# Patient Record
Sex: Male | Born: 1970 | Race: White | Hispanic: No | Marital: Married | Smoking: Current every day smoker
Health system: Southern US, Community
[De-identification: ages and names within clinical notes are randomized; demographics above are authoritative.]

## PROBLEM LIST (undated history)

## (undated) DIAGNOSIS — I10 Essential (primary) hypertension: Secondary | ICD-10-CM

## (undated) DIAGNOSIS — N200 Calculus of kidney: Secondary | ICD-10-CM

---

## 2007-10-25 ENCOUNTER — Emergency Department: Payer: Self-pay | Admitting: Unknown Physician Specialty

## 2008-03-17 ENCOUNTER — Emergency Department: Payer: Self-pay | Admitting: Emergency Medicine

## 2008-09-21 ENCOUNTER — Emergency Department: Payer: Self-pay | Admitting: Emergency Medicine

## 2009-04-03 ENCOUNTER — Inpatient Hospital Stay: Payer: Self-pay | Admitting: Vascular Surgery

## 2009-10-18 ENCOUNTER — Emergency Department: Payer: Self-pay | Admitting: Emergency Medicine

## 2010-01-17 ENCOUNTER — Emergency Department (HOSPITAL_COMMUNITY): Admission: EM | Admit: 2010-01-17 | Discharge: 2010-01-17 | Payer: Self-pay | Admitting: Emergency Medicine

## 2010-02-18 ENCOUNTER — Emergency Department (HOSPITAL_COMMUNITY): Admission: EM | Admit: 2010-02-18 | Discharge: 2010-02-18 | Payer: Self-pay | Admitting: Emergency Medicine

## 2010-03-07 ENCOUNTER — Emergency Department (HOSPITAL_COMMUNITY): Admission: EM | Admit: 2010-03-07 | Discharge: 2010-03-07 | Payer: Self-pay | Admitting: Emergency Medicine

## 2010-04-09 ENCOUNTER — Emergency Department: Payer: Self-pay | Admitting: Emergency Medicine

## 2010-05-21 ENCOUNTER — Ambulatory Visit: Payer: Self-pay | Admitting: Unknown Physician Specialty

## 2010-06-12 ENCOUNTER — Ambulatory Visit: Payer: Self-pay | Admitting: Unknown Physician Specialty

## 2010-06-25 ENCOUNTER — Ambulatory Visit: Payer: Self-pay | Admitting: Unknown Physician Specialty

## 2010-06-29 ENCOUNTER — Emergency Department: Payer: Self-pay | Admitting: Emergency Medicine

## 2010-07-18 ENCOUNTER — Emergency Department: Payer: Self-pay | Admitting: Emergency Medicine

## 2010-12-27 ENCOUNTER — Inpatient Hospital Stay: Payer: Self-pay | Admitting: Internal Medicine

## 2011-01-19 ENCOUNTER — Emergency Department: Payer: Self-pay | Admitting: Internal Medicine

## 2011-04-11 ENCOUNTER — Emergency Department: Payer: Self-pay | Admitting: Emergency Medicine

## 2011-12-15 ENCOUNTER — Emergency Department: Payer: Self-pay | Admitting: *Deleted

## 2011-12-16 ENCOUNTER — Other Ambulatory Visit: Payer: Self-pay | Admitting: Podiatry

## 2012-02-26 ENCOUNTER — Inpatient Hospital Stay: Payer: Self-pay | Admitting: Specialist

## 2012-02-26 LAB — DRUG SCREEN, URINE
Benzodiazepine, Ur Scrn: NEGATIVE (ref ?–200)
Cannabinoid 50 Ng, Ur ~~LOC~~: NEGATIVE (ref ?–50)
Cocaine Metabolite,Ur ~~LOC~~: POSITIVE (ref ?–300)
MDMA (Ecstasy)Ur Screen: NEGATIVE (ref ?–500)
Methadone, Ur Screen: POSITIVE (ref ?–300)
Phencyclidine (PCP) Ur S: NEGATIVE (ref ?–25)

## 2012-02-26 LAB — COMPREHENSIVE METABOLIC PANEL
Alkaline Phosphatase: 668 U/L — ABNORMAL HIGH (ref 50–136)
BUN: 10 mg/dL (ref 7–18)
Bilirubin,Total: 18.2 mg/dL — ABNORMAL HIGH (ref 0.2–1.0)
Calcium, Total: 9.7 mg/dL (ref 8.5–10.1)
Chloride: 104 mmol/L (ref 98–107)
Co2: 24 mmol/L (ref 21–32)
Creatinine: 0.77 mg/dL (ref 0.60–1.30)
EGFR (African American): >60
EGFR (Non-African Amer.): >60
SGOT(AST): 751 U/L — ABNORMAL HIGH (ref 15–37)
SGPT (ALT): 289 U/L — ABNORMAL HIGH (ref 12–78)

## 2012-02-26 LAB — CBC
HCT: 51.5 % (ref 40.0–52.0)
Platelet: 290 10*3/uL (ref 150–440)
RBC: 5.47 10*6/uL (ref 4.40–5.90)
RDW: 17.5 % — ABNORMAL HIGH (ref 11.5–14.5)
WBC: 7.2 10*3/uL (ref 3.8–10.6)

## 2012-02-26 LAB — URINALYSIS, COMPLETE
Bacteria: NONE SEEN
Glucose,UR: NEGATIVE mg/dL (ref 0–75)
Ketone: NEGATIVE
Leukocyte Esterase: NEGATIVE
Nitrite: NEGATIVE
Protein: NEGATIVE
RBC,UR: <1 /HPF (ref 0–5)
Specific Gravity: 1.018 (ref 1.003–1.030)
Squamous Epithelial: NONE SEEN
WBC UR: 2 /HPF (ref 0–5)

## 2012-02-26 LAB — MAGNESIUM: Magnesium: 2.7 mg/dL — ABNORMAL HIGH

## 2012-02-26 LAB — ETHANOL: Ethanol: <3 mg/dL

## 2012-02-26 LAB — BILIRUBIN, DIRECT: Bilirubin, Direct: 13.5 mg/dL — ABNORMAL HIGH (ref 0.00–0.20)

## 2012-02-26 LAB — APTT: Activated PTT: 34.9 secs (ref 23.6–35.9)

## 2012-02-26 LAB — LIPASE, BLOOD: Lipase: 290 U/L (ref 73–393)

## 2012-02-26 LAB — PROTIME-INR: Prothrombin Time: 12.6 secs (ref 11.5–14.7)

## 2012-02-27 LAB — LIPID PANEL
Cholesterol: 60 mg/dL (ref 0–200)
HDL Cholesterol: 5 mg/dL — ABNORMAL LOW (ref 40–60)
Ldl Cholesterol, Calc: 6 mg/dL (ref 0–100)
VLDL Cholesterol, Calc: 49 mg/dL — ABNORMAL HIGH (ref 5–40)

## 2012-02-27 LAB — COMPREHENSIVE METABOLIC PANEL
Albumin: 3.1 g/dL — ABNORMAL LOW (ref 3.4–5.0)
Alkaline Phosphatase: 503 U/L — ABNORMAL HIGH (ref 50–136)
Anion Gap: 7 (ref 7–16)
BUN: 10 mg/dL (ref 7–18)
Calcium, Total: 8.5 mg/dL (ref 8.5–10.1)
Co2: 24 mmol/L (ref 21–32)
Creatinine: 0.71 mg/dL (ref 0.60–1.30)
EGFR (Non-African Amer.): >60
Glucose: 82 mg/dL (ref 65–99)
Osmolality: 278 (ref 275–301)
SGOT(AST): 651 U/L — ABNORMAL HIGH (ref 15–37)
Sodium: 140 mmol/L (ref 136–145)

## 2012-02-27 LAB — RAPID HIV-1/2 QL/CONFIRM: HIV-1/2,Rapid Ql: NEGATIVE

## 2012-02-28 LAB — COMPREHENSIVE METABOLIC PANEL
Albumin: 3.1 g/dL — ABNORMAL LOW (ref 3.4–5.0)
Alkaline Phosphatase: 458 U/L — ABNORMAL HIGH (ref 50–136)
BUN: 9 mg/dL (ref 7–18)
Bilirubin,Total: 14.8 mg/dL — ABNORMAL HIGH (ref 0.2–1.0)
Co2: 22 mmol/L (ref 21–32)
Creatinine: 0.69 mg/dL (ref 0.60–1.30)
Glucose: 82 mg/dL (ref 65–99)
Osmolality: 275 (ref 275–301)
SGPT (ALT): 250 U/L — ABNORMAL HIGH (ref 12–78)
Sodium: 139 mmol/L (ref 136–145)
Total Protein: 6 g/dL — ABNORMAL LOW (ref 6.4–8.2)

## 2012-11-24 ENCOUNTER — Emergency Department: Payer: Self-pay | Admitting: Emergency Medicine

## 2012-11-24 LAB — URINALYSIS, COMPLETE
Bilirubin,UR: NEGATIVE
Glucose,UR: NEGATIVE mg/dL (ref 0–75)
Ketone: NEGATIVE
Nitrite: NEGATIVE
Protein: NEGATIVE
Specific Gravity: 1.012 (ref 1.003–1.030)
Squamous Epithelial: NONE SEEN
WBC UR: 3 /HPF (ref 0–5)

## 2012-11-24 LAB — COMPREHENSIVE METABOLIC PANEL
Alkaline Phosphatase: 100 U/L (ref 50–136)
BUN: 11 mg/dL (ref 7–18)
Calcium, Total: 9.2 mg/dL (ref 8.5–10.1)
Chloride: 109 mmol/L — ABNORMAL HIGH (ref 98–107)
Co2: 20 mmol/L — ABNORMAL LOW (ref 21–32)
Glucose: 96 mg/dL (ref 65–99)
Potassium: 3.6 mmol/L (ref 3.5–5.1)
SGOT(AST): 13 U/L — ABNORMAL LOW (ref 15–37)
Sodium: 139 mmol/L (ref 136–145)
Total Protein: 7.9 g/dL (ref 6.4–8.2)

## 2012-11-24 LAB — CBC
HCT: 43.7 % (ref 40.0–52.0)
HGB: 14.9 g/dL (ref 13.0–18.0)
MCH: 32.1 pg (ref 26.0–34.0)
MCHC: 34.1 g/dL (ref 32.0–36.0)
Platelet: 253 10*3/uL (ref 150–440)
RBC: 4.65 10*6/uL (ref 4.40–5.90)

## 2013-07-25 ENCOUNTER — Emergency Department: Payer: Self-pay | Admitting: Emergency Medicine

## 2013-08-01 ENCOUNTER — Emergency Department: Payer: Self-pay | Admitting: Emergency Medicine

## 2013-08-21 IMAGING — CT CT ABDOMEN W/ CM
1 of 2 series · 14 of 32 positions shown, 19 images · non-contrast
Comparison: none

REASON FOR EXAM: elevated transalemenis and bili eval pancreas and GB
COMMENTS:

PROCEDURE:     CT  - CT ABDOMEN STANDARD W  - February 26, 2012  [DATE]
RESULT:     Abdominal CT dated 02/26/2012.
TECHNIQUE: Helical 3 mm sections were obtained from the lung bases through
the superior iliac crest.

[Series 2: 3mm soft tissue · axial · 0.75mm/px · z∈[-440,-188]mm · 14 of 94 slices shown, 19 images]
[im 5/94  soft-tissue]
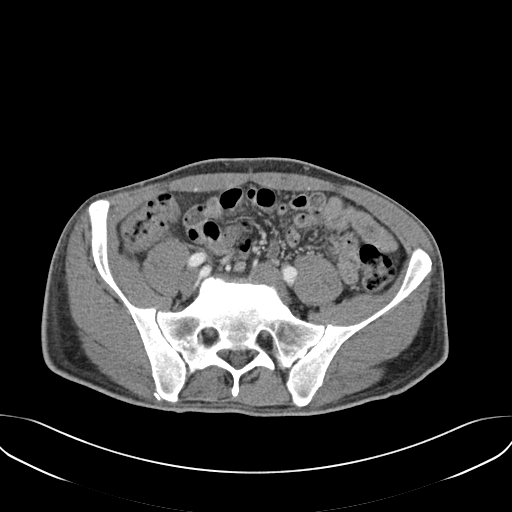
[im 5/94  bone]
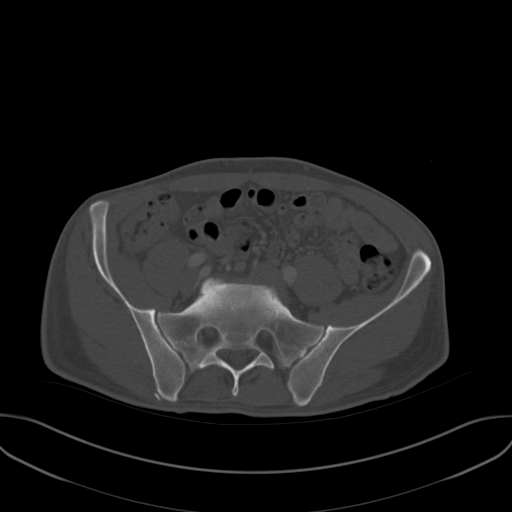
[im 14/94  soft-tissue]
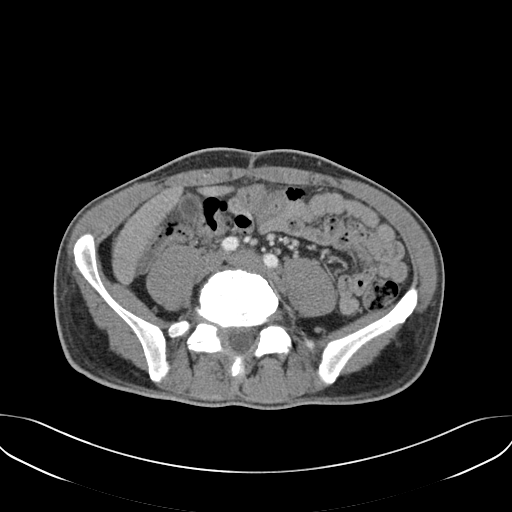
[im 19/94  soft-tissue]
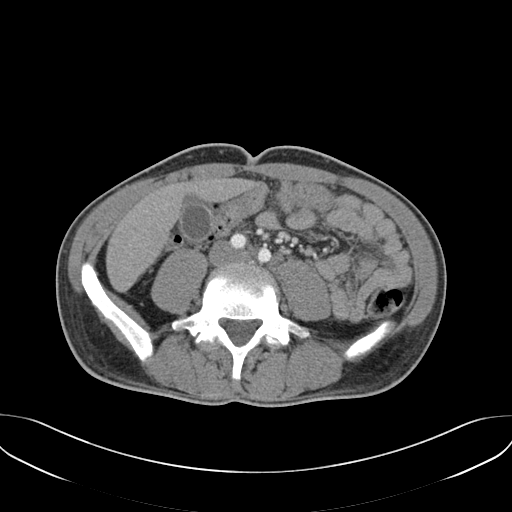
[im 28/94  soft-tissue]
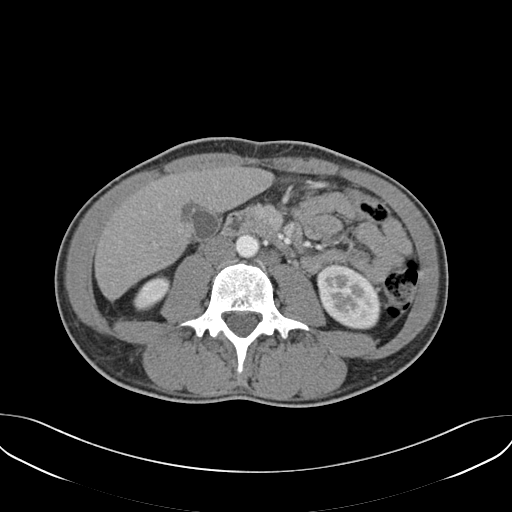
[im 33/94  soft-tissue]
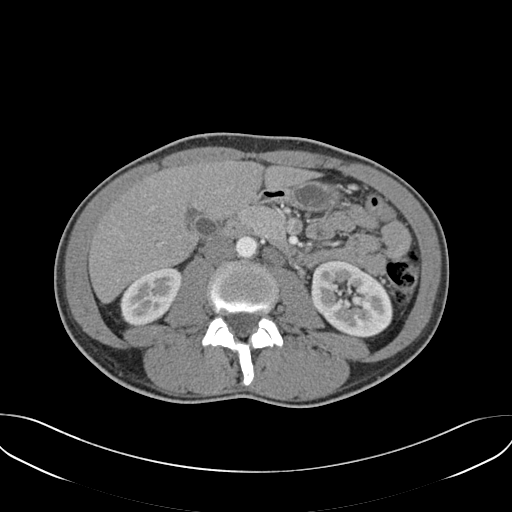
[im 42/94  soft-tissue]
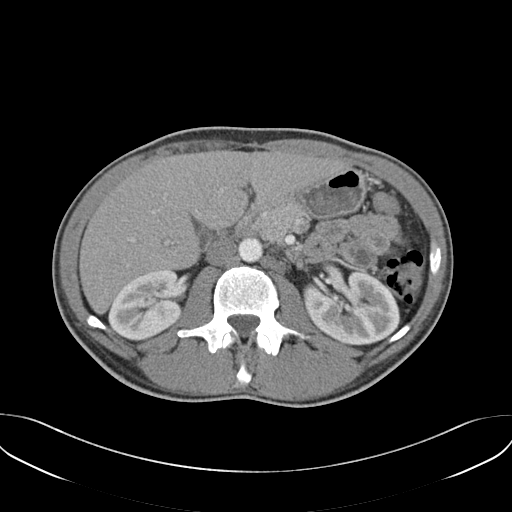
[im 47/94  soft-tissue]
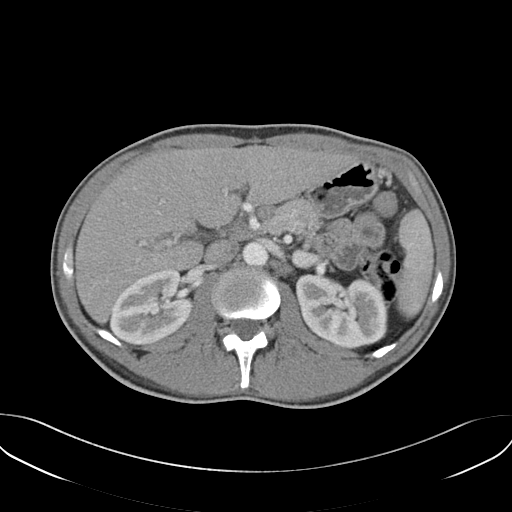
[im 52/94  soft-tissue]
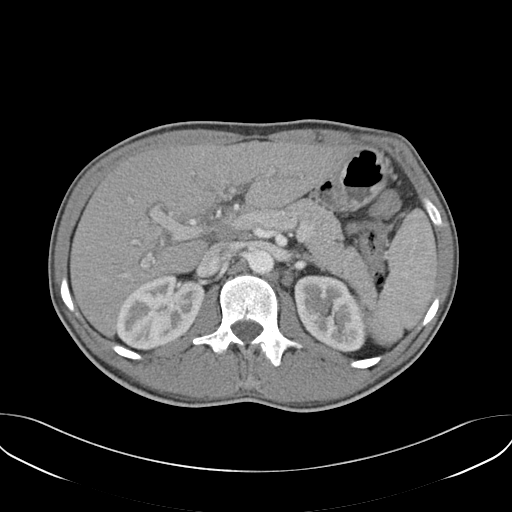
[im 61/94  soft-tissue]
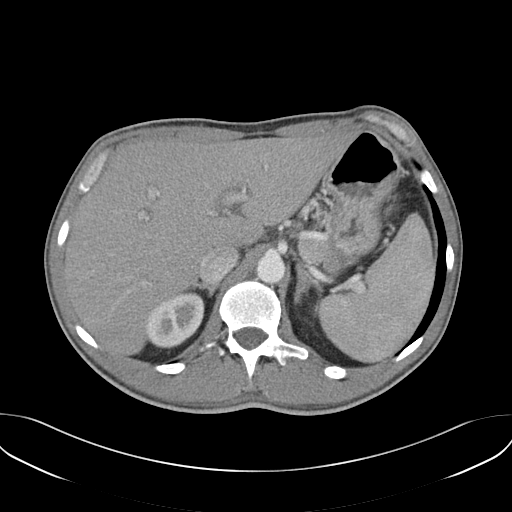
[im 61/94  bone]
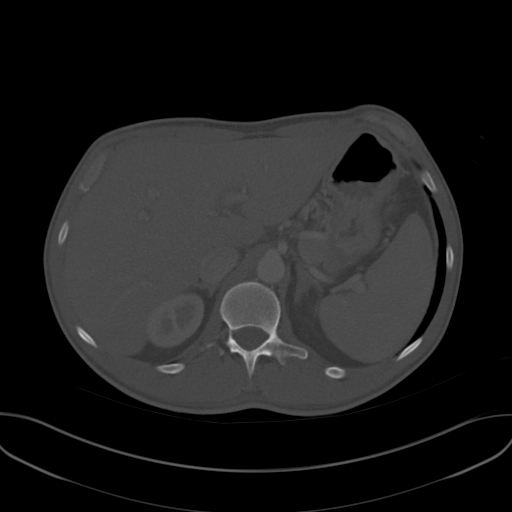
[im 66/94  soft-tissue]
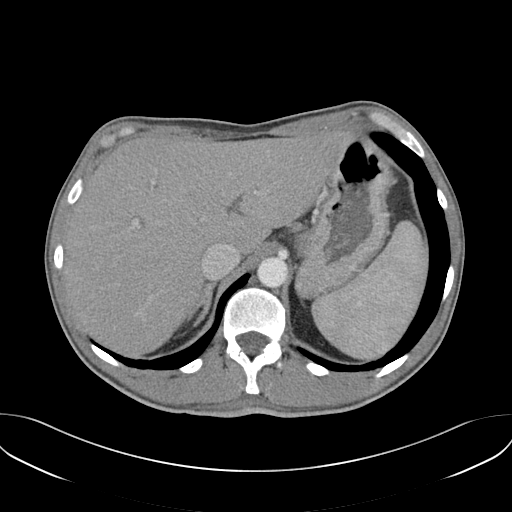
[im 75/94  soft-tissue]
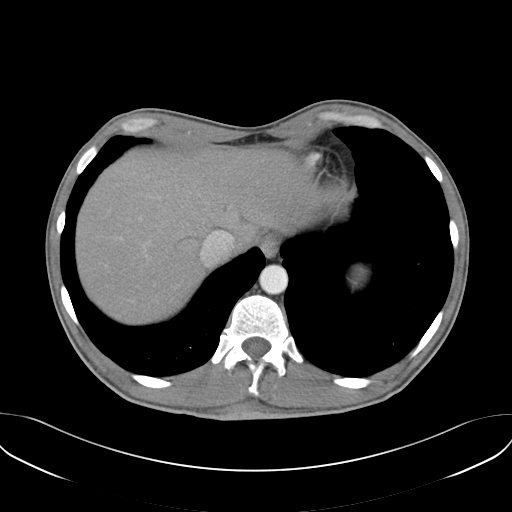
[im 75/94  lung]
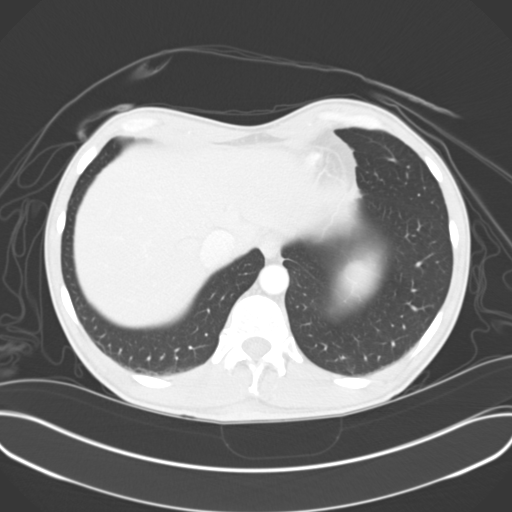
[im 80/94  soft-tissue]
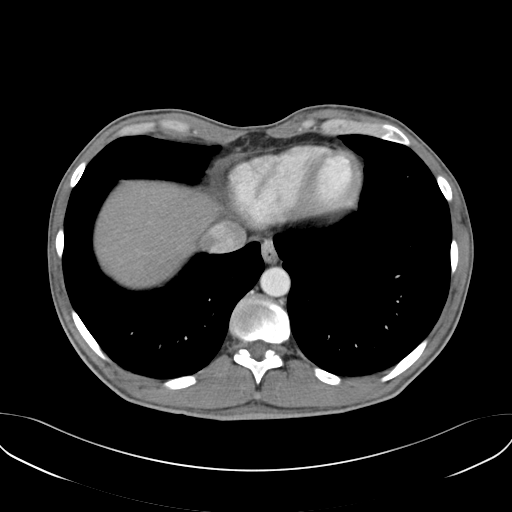
[im 80/94  lung]
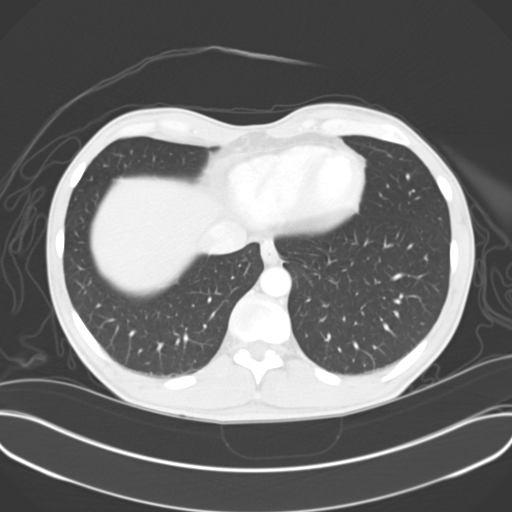
[im 84/94  lung]
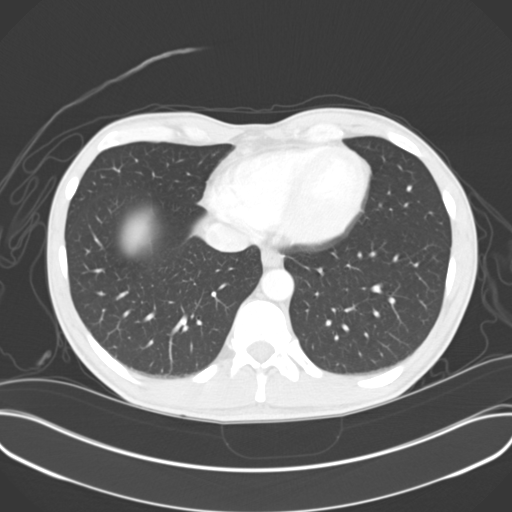
[im 89/94  soft-tissue]
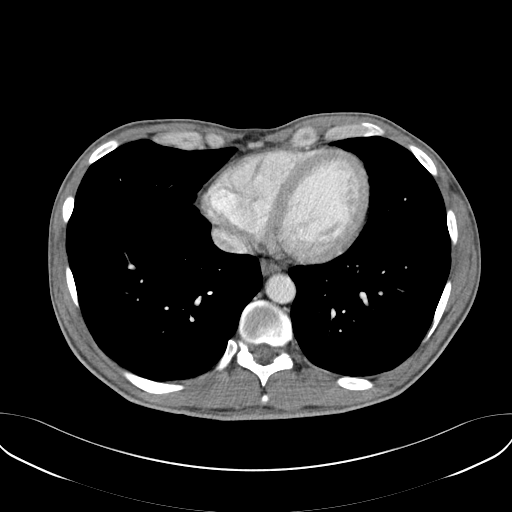
[im 89/94  lung]
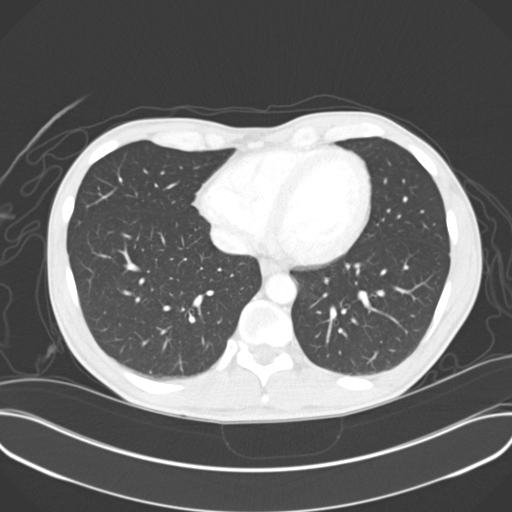

[14 of 32 positions shown; findings below may reference images not displayed]

FINDINGS: The lung bases are unremarkable.

The liver demonstrates periportal edema. There is evidence of
pericholecystic fluid. There is no evidence of calcified gallstones.
Prominent enlarged lymph nodes identified within the porta hepatis as well
as in the periaortic region. The largest lymph node measures proximal to
1.23 cm in narrowest dimensions. There is no evidence of abdominal loculated
fluid collections nor masses. The kidneys, adrenals, pancreas, spleen are
unremarkable. There is no evidence of bowel obstruction, enteritis, nor
colitis.
IMPRESSION: Findings consistent with periportal edema and enlarged
lymph nodes in the porta hepatis and retroperitoneal regions. Different
considerations are the sequela of hepatitis. There is also evidence of
pericholecystic fluid without evidence of biliary ductal dilatation or
calcified gallstones. Cholecystitis is of lower differential consideration
though cannot be excluded. Clinical correlation recommended.

## 2013-11-24 ENCOUNTER — Emergency Department: Payer: Self-pay | Admitting: Emergency Medicine

## 2013-11-24 LAB — URINALYSIS, COMPLETE
BILIRUBIN, UR: NEGATIVE
Bacteria: NONE SEEN
GLUCOSE, UR: NEGATIVE mg/dL (ref 0–75)
Ketone: NEGATIVE
Leukocyte Esterase: NEGATIVE
Nitrite: NEGATIVE
PH: 5 (ref 4.5–8.0)
Protein: NEGATIVE
RBC,UR: 109 /HPF (ref 0–5)
Specific Gravity: 1.02 (ref 1.003–1.030)
Squamous Epithelial: NONE SEEN
WBC UR: 1 /HPF (ref 0–5)

## 2013-12-01 ENCOUNTER — Ambulatory Visit: Payer: Self-pay | Admitting: Urology

## 2014-06-17 ENCOUNTER — Emergency Department: Payer: Self-pay | Admitting: Student

## 2014-06-17 LAB — COMPREHENSIVE METABOLIC PANEL
ALT: 19 U/L
ANION GAP: 7 (ref 7–16)
AST: 21 U/L (ref 15–37)
Albumin: 3.6 g/dL (ref 3.4–5.0)
Alkaline Phosphatase: 78 U/L
BUN: 13 mg/dL (ref 7–18)
Bilirubin,Total: 0.4 mg/dL (ref 0.2–1.0)
CREATININE: 0.96 mg/dL (ref 0.60–1.30)
Calcium, Total: 8.2 mg/dL — ABNORMAL LOW (ref 8.5–10.1)
Chloride: 109 mmol/L — ABNORMAL HIGH (ref 98–107)
Co2: 25 mmol/L (ref 21–32)
EGFR (African American): >60
EGFR (Non-African Amer.): >60
Glucose: 110 mg/dL — ABNORMAL HIGH (ref 65–99)
OSMOLALITY: 282 (ref 275–301)
POTASSIUM: 3.7 mmol/L (ref 3.5–5.1)
SODIUM: 141 mmol/L (ref 136–145)
TOTAL PROTEIN: 6.8 g/dL (ref 6.4–8.2)

## 2014-06-17 LAB — URINALYSIS, COMPLETE
BACTERIA: NONE SEEN
Bilirubin,UR: NEGATIVE
GLUCOSE, UR: NEGATIVE mg/dL (ref 0–75)
KETONE: NEGATIVE
Leukocyte Esterase: NEGATIVE
Nitrite: NEGATIVE
Ph: 6 (ref 4.5–8.0)
Protein: 100
RBC,UR: 312 /HPF (ref 0–5)
Specific Gravity: 1.021 (ref 1.003–1.030)
Squamous Epithelial: <1
WBC UR: 7 /HPF (ref 0–5)

## 2014-06-17 LAB — CBC
HCT: 41.8 % (ref 40.0–52.0)
HGB: 13.8 g/dL (ref 13.0–18.0)
MCH: 31.8 pg (ref 26.0–34.0)
MCHC: 33.1 g/dL (ref 32.0–36.0)
MCV: 96 fL (ref 80–100)
PLATELETS: 106 10*3/uL — AB (ref 150–440)
RBC: 4.34 10*6/uL — AB (ref 4.40–5.90)
RDW: 13.8 % (ref 11.5–14.5)
WBC: 6.6 10*3/uL (ref 3.8–10.6)

## 2014-06-17 LAB — LIPASE, BLOOD: Lipase: 107 U/L (ref 73–393)

## 2014-07-13 ENCOUNTER — Ambulatory Visit: Payer: Self-pay | Admitting: Urology

## 2014-10-31 NOTE — Discharge Summary (Signed)
PATIENT NAME:  [name], Steve Webster DATE OF BIRTH:  [birth year]  DATE OF ADMISSION:  02/26/2012 DATE OF DISCHARGE:  02/28/2012  For a detailed note, please take a look at the history and physical done on admission by Dr. Imogene Burnhen.   DIAGNOSES AT DISCHARGE:  1. Acute hepatitis secondary to hepatitis C.  2. Abnormal liver function tests due to hepatitis C.  3. History of polysubstance abuse.  4. Tobacco abuse.  5. Skin pustule.   DIET: Patient is being discharged on a regular diet.   ACTIVITY: As tolerated.   FOLLOW UP: Follow up with Dr. Mechele CollinElliott in the next 1 to 2 weeks.  DISCHARGE MEDICATIONS:  1. Oxycodone 5 mg 2 tabs every four hours as needed.  2. Bactrim double strength 1 tab b.i.d. x5 days.   CONSULTANT DURING THE HOSPITAL COURSE: Dr. Mechele CollinElliott from gastroenterology.   LABORATORY, DIAGNOSTIC AND RADIOLOGICAL DATA: CT scan of the abdomen and pelvis with contrast showing findings consistent with periportal edema and enlarged lymph nodes in the porta hepatis and retroperitoneal regions. Differential considerations is sequela of hepatitis. No evidence of biliary ductal dilatation or calcified gallstones. Chest x-ray done on admission showing no evidence of any acute cardiopulmonary disease.   BRIEF HOSPITAL COURSE: This is a 44 year old male with medical problems as mentioned above presented to the hospital on 02/26/2012 secondary to jaundice.  1. Abnormal liver function tests with jaundice. Patient presented to the hospital with a total bilirubin as high as 18. It has come down to 14 on the day of discharge. Patient's abdominal CT did not show any evidence of acute hepatobiliary pathology although his hepatitis serologies were positive for hep C which is likely the cause of his hyperbilirubinemia and abnormal liver function tests. Patient acutely does not need any treatment but he needs close follow up with GI as an outpatient. Was seen by Dr. Mechele CollinElliott who [facility]. Patient was strongly advised from IV drug abuse.  2. Abnormal liver function tests. Again this was likely secondary to acute hepatitis C. Patient has a history of IV drug abuse. His LFTs have remained about the same and can be further followed by GI as an outpatient.  3. Skin pustule. Patient had a small skin pustule in the mid thoracic back. He was afebrile. Did not appear to be infected or have been draining anything. He was empirically discharged on a five day course of p.o. Bactrim.  4. CODE STATUS: Patient is a FULL CODE.   TIME SPENT: 35 minutes.  ____________________________ Rolly PancakeVivek J. Cherlynn KaiserSainani, MD vjs:cms D: 02/28/2012 13:25:36 ET T: 03/01/2012 10:18:37 ET JOB#: [id number]  cc: Rolly PancakeVivek J. Cherlynn KaiserSainani, MD, <Dictator> Scot Junobert T. Elliott, MD Houston SirenVIVEK J SAINANI MD ELECTRONICALLY SIGNED 03/01/2012 15:20

## 2014-10-31 NOTE — Consult Note (Signed)
PATIENT NAME:  Steve GlassmanHELPS, [name] MR#:  [id number] DATE OF BIRTH:  Jan 17, 1971  DATE OF CONSULTATION:  02/26/2012  REFERRING PHYSICIAN:   CONSULTING PHYSICIAN:  Scot Junobert T. Shadia Larose, MD  CHIEF COMPLAINT: Severe jaundice.   HISTORY OF PRESENT ILLNESS: Patient is a 44 year old white male. Patient says he noticed people telling him his eyes were getting yellow about four days ago. He also noted his urine was getting dark about that time. He had a low-grade fever up to 100 a couple of days ago and he came to the ER and was found to have a very elevated total bilirubin. Decision made to admit him to the hospital for acute hepatitis. I was asked to see him in consultation.   PAST MEDICAL HISTORY:  1. Chronic back pain with previous surgery, L5-S1 diskectomy.  2. Appendectomy.  3. Hypertension.  4. Peptic ulcer disease secondary to nonsteroidal inflammatory drugs.  5. 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   REVIEW OF SYSTEMS: There is no rash. He has a sore on his back with erythema and central exudate. He does have some joint achiness but no actual inflammation. No vomiting. He has had some nausea. No melena. No bright red blood per rectum. No dysuria or hematuria. No prostate trouble. He denies sharing needles. He is a heterosexual. He supposedly had an HIV tests eight months ago that was negative. Patient quit drinking alcohol four years ago. Has used oxycodone, cocaine.   FAMILY HISTORY: Positive for coronary artery disease and chronic obstructive pulmonary disease.   ALLERGIES: None.   MEDICATIONS: He was given an unknown antibiotic by a relative a few days ago. He took an antibiotic for infection on his skin a couple three months ago.   PHYSICAL EXAMINATION:  GENERAL: White male, very jaundiced. No acute distress.   HEENT: Sclerae are very icteric. Conjunctivae negative. Tongue negative. The head is atraumatic.   CHEST: Clear.   HEART: No murmurs, gallops, clicks, or rubs I can  hear.   ABDOMEN: Questionable minimal palpable edge of the right liver. No splenomegaly. No abdominal distention.   EXTREMITIES: No edema.   SKIN: Warm and dry.   PSYCH: Mood and affect are appropriate.   LABORATORY, DIAGNOSTIC AND RADIOLOGICAL DATA: Urine shows positive cocaine and opiates. Glucose 94, BUN 10, creatinine 0.78, total bilirubin 18, direct bilirubin 13.5, SGPT 289, SGOT 751, albumin 4.3, total protein 8, lipase 290, magnesium 2.7, INR 0.9, white count 7.2, hemoglobin 17, platelets 290. Chest x-ray no acute cardiopulmonary problems. CT of the abdomen shows findings consistent with periportal edema and enlarged lymph nodes in the porta hepatis and retroperitoneal regions, evidence of pericholecystic fluid.   ASSESSMENT AND PLAN:  1. Deep jaundice, probable acute hepatitis. Hepatitis A, B, and C panel is pending.  2. Tobacco abuse.  3. Hypertension.  4. History of peptic ulcer disease.   PLAN: Await lab studies.   ____________________________ Scot Junobert T. Elisha Mcgruder, MD rte:cms D: 02/26/2012 20:45:47 ET T: 02/27/2012 05:38:34 ET  JOB#: [id number] cc: Scot Junobert T. Keymiah Lyles, MD, <Dictator> Scot JunOBERT T Wahneta Derocher MD ELECTRONICALLY SIGNED 03/02/2012 18:34

## 2014-10-31 NOTE — H&P (Signed)
PATIENT NAME:  Steve Webster, Steve Webster MR#:  [id number] DATE OF BIRTH:  1970/11/09  DATE OF ADMISSION:  02/26/2012  PRIMARY CARE PHYSICIAN:  Nonlocal.   REFERRING PHYSICIAN: Dr. Brien Mates.   HISTORY OF PRESENT ILLNESS: 44 year old gentleman with a history of chronic back pain, PUD due to NSAIDs, hypertension, not on any medication, who presented to the ED with jaundice for four days. The patient is alert, awake, and oriented. He said he started to have yellow skin about four days ago. In addition, he has fatigue, nausea with dark urine and light-colored stool for the past few days. He also has weight loss about 20 pounds for the past four months. He said he had a fever of 100 two days ago, but he denies any abdominal pain, vomiting, or diarrhea. No dysuria, hematuria, or incontinence. The patient said he used oxycodone and cocaine injection with the same needle several times 9 months ago and the last drug injection was one month ago but with a new needle. He sniffed cocaine two days ago. Since the patient has a very high bilirubin, about 18, Dr. Mechele Collin suggested to admit the patient.   PAST MEDICAL HISTORY:    1. Chronic back pain. 2. PUD due to NSAIDs. 3. Hypertension.   PAST SURGICAL HISTORY:  1. L5-S1 diskectomy for back pain.  2. Appendectomy.   SOCIAL HISTORY: The patient has been smoking 1 pack a day since 44 years old, but recently cut to five cigarettes a day. The patient quit drinking alcohol four years ago, never drink it since that time and he used cocaine, the last use was a couple of days ago. The patient also is sexually active but used protection. He only has one partner. The patient has never had a hepatitis test, but HIV test eight month ago was negative.   FAMILY HISTORY: Significant for chronic obstructive pulmonary disease, hypertension, coronary artery disease in his father.   REVIEW OF SYSTEMS: CONSTITUTIONAL: The patient has a fever, fatigue, weakness and weight loss. EYES: No double  vision. No blurred vision, but has icterus. ENT: No postnasal drip, epistaxis, slurred speech, or dysphagia. RESPIRATORY: No cough, sputum, shortness of breath, or hematemesis. CARDIOVASCULAR: No chest pain, palpitation, orthopnea, or nocturnal dyspnea. GASTROINTESTINAL: No abdominal pain, but has nausea. No vomiting or diarrhea. No melena or bloody stool but has jaundice. GENITOURINARY: No dysuria, hematuria, but has dark urine. No incontinence. ENDOCRINE: The patient has polyuria, but no polydipsia, or heat or cold intolerance. HEMATOLOGY: No easy bruising or bleeding. NEUROLOGY: No syncope, loss of consciousness or seizure. SKIN: Positive for jaundice. No rash or bruits. PSYCH: No anxiety or depression.   ALLERGIES: None.   MEDICATIONS: None.    PHYSICAL EXAMINATION:  VITAL SIGNS: Temperature 97.7, blood pressure 128/67, pulse 68, respirations 16, oxygen saturation 100% on room air.   GENERAL: The patient is alert, awake, oriented, in no acute distress.   HEENT: Pupils round, equal, reactive to light and accommodation. The patient has icterus in both eyes. No discharge from ear or nose. Moist oral mucosa. Clear oropharynx.   NECK: Supple. No JVD or carotid bruit. No lymphadenopathy. No thyromegaly.   CARDIOVASCULAR: S1, S2. Regular rate and rhythm. No murmurs or gallops.   PULMONARY: Bilateral air entry. No wheezing or rales. No use of accessory muscles to breathe.   ABDOMEN: Soft. No distention or tenderness. No organomegaly. Bowel sounds present. The patient only has mild tenderness on the right upper quadrant.   EXTREMITIES: No edema, clubbing, or cyanosis. No  calf tenderness. Strong bilateral pedal pulses.   SKIN: Positive for jaundice. No rash or bruises.  NEUROLOGIC: Alert and oriented x3. No focal deficits. Power five out of five. Sensation intact. Deep tendon reflexes 2+.   LABORATORY, DIAGNOSTIC AND RADIOLOGICAL DATA: Urinalysis negative, but bilirubin 2+. Urine toxicology  showed positive cocci and positive opiate. Glucose 94, BUN 10, creatinine 0.77. Electrolytes were normal. Bilirubin total 18.2, direct 13.5, SGPT 289, SGOT 751, albumin 4.3, total protein 8.0. Ethanol level less than 3. Lipase 290, magnesium 2.7. INR 0.9. WBC 7.2, hemoglobin 16.9, platelets 290. Chest x-ray: No pneumonia or other acute cardiopulmonary abnormality. CT scan of the abdomen with contrast showed finding consistent with periportal edema and enlarged lymph node in the porta hepatis and retroperitoneal regions. Different considerations are the sequela of hepatitis. There is also evidence of pericholecystic fluid without evidence of biliary ductal dilatation or calcified gallstone.   IMPRESSION:  1. Jaundice, possible acute hepatitis.  2. Drug abuse.  3. Tobacco abuse.  4. Hypertension.  5. History of PUD and gastrointestinal bleeding.   PLAN OF TREATMENT:  1. The patient will be admitted to the medical floor.  2. We will check hepatitis panel, HIV test, ANA and follow-up CMP.  3. We will get a gastroenterology consult from Dr. Mechele CollinElliott.  4. We will keep n.p.o. after night and start IV fluid.  5. Gastrointestinal and deep vein thrombosis prophylaxis.   I discussed the patient'Webster situation and plan of treatment with the patient.   TIME SPENT: About 55 minutes.   ____________________________ Shaune PollackQing Uriyah Massimo, MD qc:ap D: 02/26/2012 15:51:39 ET T: 02/26/2012 17:07:54 ET JOB#: [id number]  cc: Shaune PollackQing Makari Sanko, MD, <Dictator> Shaune PollackQING Magda Muise MD ELECTRONICALLY SIGNED 02/28/2012 13:43

## 2014-10-31 NOTE — Consult Note (Signed)
Chief complaint is jaundice. Pt denies chills, fever, nausea or vomiting. His rapid HIV was negative.  TB down to 15, other LFT's down a little.  No new complaints.  Will follow labs.  If A,B,C are all neg get CMV IGM and EBV IGM.  Electronic Signatures: Scot JunElliott, Amond Speranza T (MD)  (Signed on 16-Aug-13 09:35)  Authored  Last Updated: 16-Aug-13 09:35 by Scot JunElliott, Breta Demedeiros T (MD)

## 2015-05-11 ENCOUNTER — Emergency Department: Payer: Medicaid Other

## 2015-05-11 ENCOUNTER — Emergency Department
Admission: EM | Admit: 2015-05-11 | Discharge: 2015-05-11 | Disposition: A | Discharge status: Home / Self Care | Payer: Medicaid Other | Attending: Emergency Medicine | Admitting: Emergency Medicine

## 2015-05-11 DIAGNOSIS — I10 Essential (primary) hypertension: Secondary | ICD-10-CM | POA: Diagnosis not present

## 2015-05-11 DIAGNOSIS — R109 Unspecified abdominal pain: Secondary | ICD-10-CM

## 2015-05-11 DIAGNOSIS — Z720 Tobacco use: Secondary | ICD-10-CM | POA: Diagnosis not present

## 2015-05-11 DIAGNOSIS — N200 Calculus of kidney: Secondary | ICD-10-CM

## 2015-05-11 HISTORY — DX: Calculus of kidney: N20.0

## 2015-05-11 HISTORY — DX: Essential (primary) hypertension: I10

## 2015-05-11 LAB — URINALYSIS COMPLETE WITH MICROSCOPIC (ARMC ONLY)
Bilirubin Urine: NEGATIVE
GLUCOSE, UA: NEGATIVE mg/dL
Ketones, ur: NEGATIVE mg/dL
Leukocytes, UA: NEGATIVE
NITRITE: NEGATIVE
PROTEIN: NEGATIVE mg/dL
Specific Gravity, Urine: 1.024 (ref 1.005–1.030)
Squamous Epithelial / LPF: NONE SEEN
pH: 5 (ref 5.0–8.0)

## 2015-05-11 MED ORDER — HYDROMORPHONE HCL 1 MG/ML IJ SOLN
1.0000 mg | Freq: Once | INTRAMUSCULAR | Status: DC
  Filled 2015-05-11: qty 1

## 2015-05-11 MED ORDER — SODIUM CHLORIDE 0.9 % IV BOLUS (SEPSIS): 1000.0000 mL | Freq: Once | INTRAVENOUS | Status: DC

## 2015-05-11 MED ORDER — HYDROMORPHONE HCL 1 MG/ML IJ SOLN
1.0000 mg | Freq: Once | INTRAMUSCULAR | Status: AC
  Administered 2015-05-11: 1 mg via INTRAMUSCULAR
  Filled 2015-05-11: qty 1

## 2015-05-11 MED ORDER — KETOROLAC TROMETHAMINE 60 MG/2ML IM SOLN
60.0000 mg | Freq: Once | INTRAMUSCULAR | Status: AC
  Administered 2015-05-11: 60 mg via INTRAMUSCULAR
  Filled 2015-05-11: qty 2

## 2015-05-11 MED ORDER — OXYCODONE-ACETAMINOPHEN 7.5-325 MG PO TABS: 1.0000 | ORAL_TABLET | ORAL | Status: DC | PRN

## 2015-05-11 MED ORDER — KETOROLAC TROMETHAMINE 30 MG/ML IJ SOLN
30.0000 mg | Freq: Once | INTRAMUSCULAR | Status: DC
Start: 2015-05-11 — End: 2015-05-11
  Filled 2015-05-11: qty 1

## 2015-05-11 NOTE — ED Provider Notes (Signed)
[facility] Emergency Department Provider Note  ____________________________________________  Time seen: Approximately 5:38 PM  I have reviewed the triage vital signs and the nursing notes.   HISTORY  Chief Complaint Flank Pain    HPI Steve Webster is a 44 y.o. male patient complaining of acute right flank pain. Pain started 0 3:30 this morning. Patient took 4 ibuprofens and went to sleep. States pain return and 3:15 PM today. Patient denies hematuria. Patient is rating his pain as a 5/10. Patient has a history of kidney stones. No other palliative measures taken for this complaint. Patient described pain as sharp and crampy.   Past Medical History  Diagnosis Date  . Kidney calculus   . Hypertension     There are no active problems to display for this patient.   History reviewed. No pertinent past surgical history.  No current outpatient prescriptions on file.  Allergies Review of patient's allergies indicates no known allergies.  History reviewed. No pertinent family history.  Social History Social History  Substance Use Topics  . Smoking status: Current Every Day Smoker  . Smokeless tobacco: None  . Alcohol Use: No    Review of Systems Constitutional: No fever/chills Eyes: No visual changes. ENT: No sore throat. Cardiovascular: Denies chest pain. Respiratory: Denies shortness of breath. Gastrointestinal: No abdominal pain.  No nausea, no vomiting.  No diarrhea.  No constipation. Genitourinary: Negative for dysuria. Musculoskeletal: Negative for back pain. Right flank pain Skin: Negative for rash. Neurological: Negative for headaches, focal weakness or numbness. 10-point ROS otherwise negative.  ____________________________________________   PHYSICAL EXAM:  VITAL SIGNS: ED Triage Vitals  Enc Vitals Group     BP 05/11/15 1613 140/89 mmHg     Pulse Rate 05/11/15 1613 74     Resp 05/11/15 1613 16     Temp 05/11/15 1613 98.3 F  (36.8 C)     Temp Source 05/11/15 1613 Oral     SpO2 05/11/15 1613 97 %     Weight 05/11/15 1613 154 lb (69.854 kg)     Height 05/11/15 1613 5\' 11"  (1.803 m)     Head Cir --      Peak Flow --      Pain Score 05/11/15 1614 5     Pain Loc --      Pain Edu? --      Excl. in GC? --     Constitutional: Alert and oriented. Well appearing and in no acute distress. Eyes: Conjunctivae are normal. PERRL. EOMI. Head: Atraumatic. Nose: No congestion/rhinnorhea. Mouth/Throat: Mucous membranes are moist.  Oropharynx non-erythematous. Neck: No stridor. No cervical spine tenderness to palpation. Hematological/Lymphatic/Immunilogical: No cervical lymphadenopathy. Cardiovascular: Normal rate, regular rhythm. Grossly normal heart sounds.  Good peripheral circulation. Respiratory: Normal respiratory effort.  No retractions. Lungs CTAB. Gastrointestinal: Soft and nontender. No distention. No abdominal bruits. Right CVA guarding Musculoskeletal: No lower extremity tenderness nor edema.  No joint effusions. Neurologic:  Normal speech and language. No gross focal neurologic deficits are appreciated. No gait instability. Skin:  Skin is warm, dry and intact. No rash noted. Psychiatric: Mood and affect are normal. Speech and behavior are normal.  ____________________________________________   LABS (all labs ordered are listed, but only abnormal results are displayed)  Labs Reviewed  URINALYSIS COMPLETEWITH MICROSCOPIC ([facility] ONLY) - Abnormal; Notable for the following:    Color, Urine YELLOW (*)    APPearance CLEAR (*)    Hgb urine dipstick 3+ (*)    Bacteria, UA RARE (*)  All other components within normal limits   ____________________________________________  EKG   ____________________________________________  RADIOLOGY  CT scan revealed 2 mm proximal right uterine calculus with mild right hydronephrosis. ____________________________________________   PROCEDURES  Procedure(s)  performed: None  Critical Care performed: No  ____________________________________________   INITIAL IMPRESSION / ASSESSMENT AND PLAN / ED COURSE  Pertinent labs & imaging results that were available during my care of the patient were reviewed by me and considered in my medical decision making (see chart for details).  Right kidney stone. Discussed findings with patient advising stone is small nose the past. He is given instructions on home care and a prescription for Percocets. Patient advised to follow-up with urology clinic if condition persists. ____________________________________________   FINAL CLINICAL IMPRESSION(S) / ED DIAGNOSES  Final diagnoses:  Acute right flank pain      Joni Reining, PA-C 05/11/15 1908  Myrna Blazer, MD 05/11/15 2358

## 2015-05-11 NOTE — ED Notes (Signed)
Patient transported to CT 

## 2015-05-11 NOTE — ED Notes (Signed)
Patient comes in complaining of pain to right flank that started at 03:30am.  Patient took 4 ibuprofen and went to sleep.  Right flank pain started back at 03:15pm.  Patient denies blood in urine.

## 2015-07-22 ENCOUNTER — Encounter: Payer: Self-pay | Admitting: Emergency Medicine

## 2015-07-22 ENCOUNTER — Emergency Department: Payer: Medicaid Other

## 2015-07-22 ENCOUNTER — Emergency Department
Admission: EM | Admit: 2015-07-22 | Discharge: 2015-07-22 | Disposition: A | Discharge status: Home / Self Care | Payer: Medicaid Other | Attending: Emergency Medicine | Admitting: Emergency Medicine

## 2015-07-22 DIAGNOSIS — M25551 Pain in right hip: Secondary | ICD-10-CM | POA: Diagnosis not present

## 2015-07-22 DIAGNOSIS — I10 Essential (primary) hypertension: Secondary | ICD-10-CM | POA: Insufficient documentation

## 2015-07-22 DIAGNOSIS — F17200 Nicotine dependence, unspecified, uncomplicated: Secondary | ICD-10-CM | POA: Diagnosis not present

## 2015-07-22 MED ORDER — HYDROCODONE-ACETAMINOPHEN 5-325 MG PO TABS: 1.0000 | ORAL_TABLET | ORAL | Status: DC | PRN

## 2015-07-22 MED ORDER — OXYCODONE HCL 5 MG PO TABS: 5.0000 mg | ORAL_TABLET | ORAL | Status: AC | PRN

## 2015-07-22 MED ORDER — IBUPROFEN 800 MG PO TABS: 800.0000 mg | ORAL_TABLET | Freq: Three times a day (TID) | ORAL | Status: AC | PRN

## 2015-07-22 MED ORDER — HYDROCODONE-ACETAMINOPHEN 5-325 MG PO TABS
2.0000 | ORAL_TABLET | Freq: Once | ORAL | Status: AC
  Administered 2015-07-22: 2 via ORAL
  Filled 2015-07-22: qty 2

## 2015-07-22 MED ORDER — KETOROLAC TROMETHAMINE 60 MG/2ML IM SOLN
60.0000 mg | Freq: Once | INTRAMUSCULAR | Status: AC
  Administered 2015-07-22: 60 mg via INTRAMUSCULAR
  Filled 2015-07-22: qty 2

## 2015-07-22 MED ORDER — BACLOFEN 10 MG PO TABS: 10.0000 mg | ORAL_TABLET | Freq: Three times a day (TID) | ORAL | Status: AC

## 2015-07-22 NOTE — ED Notes (Signed)
NAD noted at this time. Pt taken to lobby via wheelchair. Pt states he feels better. Denies comments/concerns at this time.

## 2015-07-22 NOTE — ED Notes (Signed)
Reports waking up with right hip pain.  Denies injury.

## 2015-07-22 NOTE — ED Provider Notes (Signed)
[facility] Emergency Department Provider Note  ____________________________________________  Time seen: Approximately 10:07 AM  I have reviewed the triage vital signs and the nursing notes.   HISTORY  Chief Complaint Hip Pain    HPI Steve Webster is a 45 y.o. male who presents emergency room with complaints of waking up with right hip pain this morning. Denies any trauma or injury.   Past Medical History  Diagnosis Date  . Kidney calculus   . Hypertension     There are no active problems to display for this patient.   History reviewed. No pertinent past surgical history.  Current Outpatient Rx  Name  Route  Sig  Dispense  Refill  . baclofen (LIORESAL) 10 MG tablet   Oral   Take 1 tablet (10 mg total) by mouth 3 (three) times daily.   30 tablet   0   . HYDROcodone-acetaminophen (NORCO) 5-325 MG tablet   Oral   Take 1-2 tablets by mouth every 4 (four) hours as needed for moderate pain.   15 tablet   0   . ibuprofen (ADVIL,MOTRIN) 800 MG tablet   Oral   Take 1 tablet (800 mg total) by mouth every 8 (eight) hours as needed.   30 tablet   0   . oxyCODONE-acetaminophen (PERCOCET) 7.5-325 MG tablet   Oral   Take 1 tablet by mouth every 4 (four) hours as needed for severe pain.   20 tablet   0     Allergies Review of patient's allergies indicates no known allergies.  History reviewed. No pertinent family history.  Social History Social History  Substance Use Topics  . Smoking status: Current Every Day Smoker  . Smokeless tobacco: None  . Alcohol Use: No    Review of Systems Constitutional: No fever/chills Eyes: No visual changes. ENT: No sore throat. Cardiovascular: Denies chest pain. Respiratory: Denies shortness of breath. Gastrointestinal: No abdominal pain.  No nausea, no vomiting.  No diarrhea.  No constipation. Genitourinary: Negative for dysuria. Musculoskeletal: Positive for right hip pain. Skin: Negative for  rash. Neurological: Negative for headaches, focal weakness or numbness.  10-point ROS otherwise negative.  ____________________________________________   PHYSICAL EXAM:  VITAL SIGNS: ED Triage Vitals  Enc Vitals Group     BP 07/22/15 0958 127/50 mmHg     Pulse Rate 07/22/15 0958 88     Resp 07/22/15 0958 20     Temp 07/22/15 0958 98.3 F (36.8 C)     Temp Source 07/22/15 0958 Oral     SpO2 07/22/15 0958 100 %     Weight 07/22/15 0958 152 lb (68.947 kg)     Height 07/22/15 0958 5\' 11"  (1.803 m)     Head Cir --      Peak Flow --      Pain Score 07/22/15 0952 10     Pain Loc --      Pain Edu? --      Excl. in GC? --     Constitutional: Alert and oriented. Well appearing and in no acute distress.   Cardiovascular: Normal rate, regular rhythm. Grossly normal heart sounds.  Good peripheral circulation. Respiratory: Normal respiratory effort.  No retractions. Lungs CTAB. Gastrointestinal: Soft and nontender. No distention. No abdominal bruits. No CVA tenderness. Musculoskeletal: No lower extremity tenderness nor edema.  No joint effusions. Neurologic:  Normal speech and language. No gross focal neurologic deficits are appreciated. No gait instability. Skin:  Skin is warm, dry and intact. No rash  noted. Psychiatric: Mood and affect are normal. Speech and behavior are normal.  ____________________________________________   LABS (all labs ordered are listed, but only abnormal results are displayed)  Labs Reviewed - No data to display ____________________________________________   RADIOLOGY  Negative for any acute osseous findings. ____________________________________________   PROCEDURES  Procedure(s) performed: None  Critical Care performed: No  ____________________________________________   INITIAL IMPRESSION / ASSESSMENT AND PLAN / ED COURSE  Pertinent labs & imaging results that were available during my care of the patient were reviewed by me and considered  in my medical decision making (see chart for details).  Acute right hip pain. Etiology unknown. Rx for Naprosyn 500 mg 3 times a day, baclofen 10 mg 3 times a day. Patient to follow up with PCP or return to the ER with any worsening symptomology. Referral given to orthopedics on call if needed. ____________________________________________   FINAL CLINICAL IMPRESSION(S) / ED DIAGNOSES  Final diagnoses:  Hip pain, acute, right      Evangeline DakinCharles M Beers, PA-C 07/22/15 1106  Rockne MenghiniAnne-Caroline Norman, MD 07/22/15 1502

## 2015-07-22 NOTE — Discharge Instructions (Signed)
Heat Therapy Heat therapy can help ease sore, stiff, injured, and tight muscles and joints. Heat relaxes your muscles, which may help ease your pain. Heat therapy should only be used on old, pre-existing, or long-lasting (chronic) injuries. Do not use heat therapy unless told by your doctor. HOW TO USE HEAT THERAPY There are several different kinds of heat therapy, including:  Moist heat pack.  Warm water bath.  Hot water bottle.  Electric heating pad.  Heated gel pack.  Heated wrap.  Electric heating pad. GENERAL HEAT THERAPY RECOMMENDATIONS   Do not sleep while using heat therapy. Only use heat therapy while you are awake.  Your skin may turn pink while using heat therapy. Do not use heat therapy if your skin turns red.  Do not use heat therapy if you have new pain.  High heat or long exposure to heat can cause burns. Be careful when using heat therapy to avoid burning your skin.  Do not use heat therapy on areas of your skin that are already irritated, such as with a rash or sunburn. GET HELP IF:   You have blisters, redness, swelling (puffiness), or numbness.  You have new pain.  Your pain is worse. MAKE SURE YOU:  Understand these instructions.  Will watch your condition.  Will get help right away if you are not doing well or get worse.   This information is not intended to replace advice given to you by your health care provider. Make sure you discuss any questions you have with your health care provider.   Document Released: 09/22/2011 Document Revised: 07/21/2014 Document Reviewed: 08/23/2013 Elsevier Interactive Patient Education 2016 Else[employer] Inc[employer]  Cryotherapy Cryotherapy means treatment with cold. Ice or gel packs can be used to reduce both pain and swelling. Ice is the most helpful within the first 24 to 48 hours after an injury or flare-up from overusing a muscle or joint. Sprains, strains, spasms, burning pain, shooting pain, and aches can all be eased  with ice. Ice can also be used when recovering from surgery. Ice is effective, has very few side effects, and is safe for most people to use. PRECAUTIONS  Ice is not a safe treatment option for people with:  Raynaud phenomenon. This is a condition affecting small blood vessels in the extremities. Exposure to cold may cause your problems to return.  Cold hypersensitivity. There are many forms of cold hypersensitivity, including:  Cold urticaria. Red, itchy hives appear on the skin when the tissues begin to warm after being iced.  Cold erythema. This is a red, itchy rash caused by exposure to cold.  Cold hemoglobinuria. Red blood cells break down when the tissues begin to warm after being iced. The hemoglobin that carry oxygen are passed into the urine because they cannot combine with blood proteins fast enough.  Numbness or altered sensitivity in the area being iced. If you have any of the following conditions, do not use ice until you have discussed cryotherapy with your caregiver:  Heart conditions, such as arrhythmia, angina, or chronic heart disease.  High blood pressure.  Healing wounds or open skin in the area being iced.  Current infections.  Rheumatoid arthritis.  Poor circulation.  Diabetes. Ice slows the blood flow in the region it is applied. This is beneficial when trying to stop inflamed tissues from spreading irritating chemicals to surrounding tissues. However, if you expose your skin to cold temperatures for too long or without the proper protection, you can damage your skin or  nerves. Watch for signs of skin damage due to cold. HOME CARE INSTRUCTIONS Follow these tips to use ice and cold packs safely.  Place a dry or damp towel between the ice and skin. A damp towel will cool the skin more quickly, so you may need to shorten the time that the ice is used.  For a more rapid response, add gentle compression to the ice.  Ice for no more than 10 to 20 minutes at a  time. The bonier the area you are icing, the less time it will take to get the benefits of ice.  Check your skin after 5 minutes to make sure there are no signs of a poor response to cold or skin damage.  Rest 20 minutes or more between uses.  Once your skin is numb, you can end your treatment. You can test numbness by very lightly touching your skin. The touch should be so light that you do not see the skin dimple from the pressure of your fingertip. When using ice, most people will feel these normal sensations in this order: cold, burning, aching, and numbness.  Do not use ice on someone who cannot communicate their responses to pain, such as small children or people with dementia. HOW TO MAKE AN ICE PACK Ice packs are the most common way to use ice therapy. Other methods include ice massage, ice baths, and cryosprays. Muscle creams that cause a cold, tingly feeling do not offer the same benefits that ice offers and should not be used as a substitute unless recommended by your caregiver. To make an ice pack, do one of the following:  Place crushed ice or a bag of frozen vegetables in a sealable plastic bag. Squeeze out the excess air. Place this bag inside another plastic bag. Slide the bag into a pillowcase or place a damp towel between your skin and the bag.  Mix 3 parts water with 1 part rubbing alcohol. Freeze the mixture in a sealable plastic bag. When you remove the mixture from the freezer, it will be slushy. Squeeze out the excess air. Place this bag inside another plastic bag. Slide the bag into a pillowcase or place a damp towel between your skin and the bag. SEEK MEDICAL CARE IF:  You develop white spots on your skin. This may give the skin a blotchy (mottled) appearance.  Your skin turns blue or pale.  Your skin becomes waxy or hard.  Your swelling gets worse. MAKE SURE YOU:   Understand these instructions.  Will watch your condition.  Will get help right away if you are  not doing well or get worse.   This information is not intended to replace advice given to you by your health care provider. Make sure you discuss any questions you have with your health care provider.   Document Released: 02/24/2011 Document Revised: 07/21/2014 Document Reviewed: 02/24/2011 Elsevier Interactive Patient Education 2016 [employer]  Hip Pain Your hip is the joint between your upper legs and your lower pelvis. The bones, cartilage, tendons, and muscles of your hip joint perform a lot of work each day supporting your body weight and allowing you to move around. Hip pain can range from a minor ache to severe pain in one or both of your hips. Pain may be felt on the inside of the hip joint near the groin, or the outside near the buttocks and upper thigh. You may have swelling or stiffness as well.  HOME CARE INSTRUCTIONS   Take  medicines only as directed by your health care provider.  Apply ice to the injured area:  Put ice in a plastic bag.  Place a towel between your skin and the bag.  Leave the ice on for 15-20 minutes at a time, 3-4 times a day.  Keep your leg raised (elevated) when possible to lessen swelling.  Avoid activities that cause pain.  Follow specific exercises as directed by your health care provider.  Sleep with a pillow between your legs on your most comfortable side.  Record how often you have hip pain, the location of the pain, and what it feels like. SEEK MEDICAL CARE IF:   You are unable to put weight on your leg.  Your hip is red or swollen or very tender to touch.  Your pain or swelling continues or worsens after 1 week.  You have increasing difficulty walking.  You have a fever. SEEK IMMEDIATE MEDICAL CARE IF:   You have fallen.  You have a sudden increase in pain and swelling in your hip. MAKE SURE YOU:   Understand these instructions.  Will watch your condition.  Will get help right away if you are not doing well or get  worse.   This information is not intended to replace advice given to you by your health care provider. Make sure you discuss any questions you have with your health care provider.   Document Released: 12/18/2009 Document Revised: 07/21/2014 Document Reviewed: 02/24/2013 Elsevier Interactive Patient Education 2016 [employer].  Joint Pain Joint pain, which is also called arthralgia, can be caused by many things. Joint pain often goes away when you follow your health care provider's instructions for relieving pain at home. However, joint pain can also be caused by conditions that require further treatment. Common causes of joint pain include:  Bruising in the area of the joint.  Overuse of the joint.  Wear and tear on the joints that occur with aging (osteoarthritis).  Various other forms of arthritis.  A buildup of a crystal form of uric acid in the joint (gout).  Infections of the joint (septic arthritis) or of the bone (osteomyelitis). Your health care provider may recommend medicine to help with the pain. If your joint pain continues, additional tests may be needed to diagnose your condition. HOME CARE INSTRUCTIONS Watch your condition for any changes. Follow these instructions as directed to lessen the pain that you are feeling.  Take medicines only as directed by your health care provider.  Rest the affected area for as long as your health care provider says that you should. If directed to do so, raise the painful joint above the level of your heart while you are sitting or lying down.  Do not do things that cause or worsen pain.  If directed, apply ice to the painful area:  Put ice in a plastic bag.  Place a towel between your skin and the bag.  Leave the ice on for 20 minutes, 2-3 times per day.  Wear an elastic bandage, splint, or sling as directed by your health care provider. Loosen the elastic bandage or splint if your fingers or toes become numb and tingle, or if  they turn cold and blue.  Begin exercising or stretching the affected area as directed by your health care provider. Ask your health care provider what types of exercise are safe for you.  Keep all follow-up visits as directed by your health care provider. This is important. SEEK MEDICAL CARE IF:  Your  pain increases, and medicine does not help.  Your joint pain does not improve within 3 days.  You have increased bruising or swelling.  You have a fever.  You lose 10 lb (4.5 kg) or more without trying. SEEK IMMEDIATE MEDICAL CARE IF:  You are not able to move the joint.  Your fingers or toes become numb or they turn cold and blue.   This information is not intended to replace advice given to you by your health care provider. Make sure you discuss any questions you have with your health care provider.   Document Released: 06/30/2005 Document Revised: 07/21/2014 Document Reviewed: 04/11/2014 Elsevier Interactive Patient Education 2016 Else[employer] Inc.  Musculoskeletal Pain Musculoskeletal pain is muscle and boney aches and pains. These pains can occur in any part of the body. Your caregiver may treat you without knowing the cause of the pain. They may treat you if blood or urine tests, X-rays, and other tests were normal.  CAUSES There is often not a definite cause or reason for these pains. These pains may be caused by a type of germ (virus). The discomfort may also come from overuse. Overuse includes working out too hard when your body is not fit. Boney aches also come from weather changes. Bone is sensitive to atmospheric pressure changes. HOME CARE INSTRUCTIONS   Ask when your test results will be ready. Make sure you get your test results.  Only take over-the-counter or prescription medicines for pain, discomfort, or fever as directed by your caregiver. If you were given medications for your condition, do not drive, operate machinery or power tools, or sign legal documents for 24  hours. Do not drink alcohol. Do not take sleeping pills or other medications that may interfere with treatment.  Continue all activities unless the activities cause more pain. When the pain lessens, slowly resume normal activities. Gradually increase the intensity and duration of the activities or exercise.  During periods of severe pain, bed rest may be helpful. Lay or sit in any position that is comfortable.  Putting ice on the injured area.  Put ice in a bag.  Place a towel between your skin and the bag.  Leave the ice on for 15 to 20 minutes, 3 to 4 times a day.  Follow up with your caregiver for continued problems and no reason can be found for the pain. If the pain becomes worse or does not go away, it may be necessary to repeat tests or do additional testing. Your caregiver may need to look further for a possible cause. SEEK IMMEDIATE MEDICAL CARE IF:  You have pain that is getting worse and is not relieved by medications.  You develop chest pain that is associated with shortness or breath, sweating, feeling sick to your stomach (nauseous), or throw up (vomit).  Your pain becomes localized to the abdomen.  You develop any new symptoms that seem different or that concern you. MAKE SURE YOU:   Understand these instructions.  Will watch your condition.  Will get help right away if you are not doing well or get worse.   This information is not intended to replace advice given to you by your health care provider. Make sure you discuss any questions you have with your health care provider.   Document Released: 06/30/2005 Document Revised: 09/22/2011 Document Reviewed: 03/04/2013 Elsevier Interactive Patient Education [employer]

## 2015-07-26 ENCOUNTER — Emergency Department
Admission: EM | Admit: 2015-07-26 | Discharge: 2015-07-26 | Disposition: A | Discharge status: Home / Self Care | Payer: Medicaid Other | Attending: Emergency Medicine | Admitting: Emergency Medicine

## 2015-07-26 ENCOUNTER — Emergency Department: Payer: Medicaid Other

## 2015-07-26 ENCOUNTER — Encounter: Payer: Self-pay | Admitting: Medical Oncology

## 2015-07-26 DIAGNOSIS — Z79899 Other long term (current) drug therapy: Secondary | ICD-10-CM | POA: Insufficient documentation

## 2015-07-26 DIAGNOSIS — I10 Essential (primary) hypertension: Secondary | ICD-10-CM | POA: Diagnosis not present

## 2015-07-26 DIAGNOSIS — M25551 Pain in right hip: Secondary | ICD-10-CM | POA: Diagnosis not present

## 2015-07-26 DIAGNOSIS — F17200 Nicotine dependence, unspecified, uncomplicated: Secondary | ICD-10-CM | POA: Insufficient documentation

## 2015-07-26 LAB — URINALYSIS COMPLETE WITH MICROSCOPIC (ARMC ONLY)
BACTERIA UA: NONE SEEN
Bilirubin Urine: NEGATIVE
Glucose, UA: NEGATIVE mg/dL
KETONES UR: NEGATIVE mg/dL
Leukocytes, UA: NEGATIVE
Nitrite: NEGATIVE
PH: 6 (ref 5.0–8.0)
PROTEIN: 30 mg/dL — AB
SQUAMOUS EPITHELIAL / LPF: NONE SEEN
Specific Gravity, Urine: 1.014 (ref 1.005–1.030)

## 2015-07-26 LAB — CBC WITH DIFFERENTIAL/PLATELET
Basophils Absolute: 0.1 10*3/uL (ref 0–0.1)
Basophils Relative: 1 %
Eosinophils Absolute: 0 10*3/uL (ref 0–0.7)
Eosinophils Relative: 0 %
HEMATOCRIT: 42.9 % (ref 40.0–52.0)
HEMOGLOBIN: 13.9 g/dL (ref 13.0–18.0)
LYMPHS ABS: 0.7 10*3/uL — AB (ref 1.0–3.6)
LYMPHS PCT: 5 %
MCH: 30 pg (ref 26.0–34.0)
MCHC: 32.5 g/dL (ref 32.0–36.0)
MCV: 92.5 fL (ref 80.0–100.0)
MONOS PCT: 12 %
Monocytes Absolute: 1.9 10*3/uL — ABNORMAL HIGH (ref 0.2–1.0)
NEUTROS PCT: 82 %
Neutro Abs: 13.3 10*3/uL — ABNORMAL HIGH (ref 1.4–6.5)
Platelets: 128 10*3/uL — ABNORMAL LOW (ref 150–440)
RBC: 4.64 MIL/uL (ref 4.40–5.90)
RDW: 13.8 % (ref 11.5–14.5)
WBC: 16.1 10*3/uL — AB (ref 3.8–10.6)

## 2015-07-26 LAB — BASIC METABOLIC PANEL
Anion gap: 10 (ref 5–15)
BUN: 14 mg/dL (ref 6–20)
CHLORIDE: 98 mmol/L — AB (ref 101–111)
CO2: 24 mmol/L (ref 22–32)
Calcium: 9 mg/dL (ref 8.9–10.3)
Creatinine, Ser: 0.71 mg/dL (ref 0.61–1.24)
GFR calc Af Amer: >60 mL/min (ref >60)
GFR calc non Af Amer: >60 mL/min (ref >60)
GLUCOSE: 114 mg/dL — AB (ref 65–99)
POTASSIUM: 3.6 mmol/L (ref 3.5–5.1)
Sodium: 132 mmol/L — ABNORMAL LOW (ref 135–145)

## 2015-07-26 MED ORDER — PREDNISONE 10 MG PO TABS: ORAL_TABLET | ORAL | Status: AC

## 2015-07-26 MED ORDER — KETOROLAC TROMETHAMINE 30 MG/ML IJ SOLN
30.0000 mg | Freq: Once | INTRAMUSCULAR | Status: AC
  Administered 2015-07-26: 30 mg via INTRAVENOUS
  Filled 2015-07-26: qty 1

## 2015-07-26 MED ORDER — SODIUM CHLORIDE 0.9 % IV BOLUS (SEPSIS)
1000.0000 mL | Freq: Once | INTRAVENOUS | Status: AC
Start: 2015-07-26 — End: 2015-07-26
  Administered 2015-07-26: 1000 mL via INTRAVENOUS

## 2015-07-26 NOTE — ED Notes (Signed)
Assessment per PA 

## 2015-07-26 NOTE — Discharge Instructions (Signed)
Begin taking prednisone today as directed. [facility] for an appointment with Dr. Ernest PineHooten. You may use ice or heat to your hip as needed for comfort.

## 2015-07-26 NOTE — ED Notes (Signed)
Unable to obtain iv access.

## 2015-07-26 NOTE — ED Notes (Signed)
E-signature box not working. Pt verbalized understanding of discharge instructions. 

## 2015-07-26 NOTE — ED Provider Notes (Signed)
[facility] Emergency Department Provider Note  ____________________________________________  Time seen: Approximately 11:30 AM  I have reviewed the triage vital signs and the nursing notes.   HISTORY  Chief Complaint Hip Pain   HPI Steve Webster is a 45 y.o. male is here with complaint of right hip pain since 1/6. Patient was seen on 07/22/2015 for the same complaint.Patient was discharged on naproxen 500 mg 3 times a day and baclofen 10 mg 3 times a day and follow-up with orthopedist on call. Patient states that he did not call anyone but understood he was returned to the emergency room if any continued problems. Today he is concerned that he has "infection" in his right hip. He continues to walk with  assistance of a cane. He states that when he was here before he got great results and relief from a shot of Toradol.   Past Medical History  Diagnosis Date  . Kidney calculus   . Hypertension     There are no active problems to display for this patient.   History reviewed. No pertinent past surgical history.  Current Outpatient Rx  Name  Route  Sig  Dispense  Refill  . baclofen (LIORESAL) 10 MG tablet   Oral   Take 1 tablet (10 mg total) by mouth 3 (three) times daily.   30 tablet   0   . ibuprofen (ADVIL,MOTRIN) 800 MG tablet   Oral   Take 1 tablet (800 mg total) by mouth every 8 (eight) hours as needed.   30 tablet   0   . oxyCODONE (OXY IR/ROXICODONE) 5 MG immediate release tablet   Oral   Take 1 tablet (5 mg total) by mouth every 4 (four) hours as needed for severe pain.   10 tablet   0   . predniSONE (DELTASONE) 10 MG tablet      Take 6 tablets  today, on day 2 take 5 tablets, day 3 take 4 tablets, day 4 take 3 tablets, day 5 take  2 tablets and 1 tablet the last day   21 tablet   0     Allergies Review of patient's allergies indicates no known allergies.  No family history on file.  Social History Social History  Substance  Use Topics  . Smoking status: Current Every Day Smoker  . Smokeless tobacco: None  . Alcohol Use: No    Review of Systems Constitutional: No fever/chills Eyes: No visual changes. ENT: No sore throat. Cardiovascular: Denies chest pain. Respiratory: Denies shortness of breath. Gastrointestinal: No abdominal pain.  No nausea, no vomiting.  No diarrhea.  No constipation. Genitourinary: Negative for dysuria. Musculoskeletal: Negative for back pain. Positive right hip pain. Skin: Negative for rash. Neurological: Negative for headaches, focal weakness or numbness.  10-point ROS otherwise negative.  ____________________________________________   PHYSICAL EXAM:  VITAL SIGNS: ED Triage Vitals  Enc Vitals Group     BP 07/26/15 1034 113/69 mmHg     Pulse Rate 07/26/15 1034 92     Resp 07/26/15 1034 18     Temp 07/26/15 1034 98.2 F (36.8 C)     Temp Source 07/26/15 1034 Oral     SpO2 07/26/15 1034 97 %     Weight 07/26/15 1034 152 lb (68.947 kg)     Height --      Head Cir --      Peak Flow --      Pain Score 07/26/15 1034 7     Pain  Loc --      Pain Edu? --      Excl. in GC? --     Constitutional: Alert and oriented. Well appearing and in no acute distress. Eyes: Conjunctivae are normal. PERRL. EOMI. Head: Atraumatic. Nose: No congestion/rhinnorhea. Neck: No stridor.   Cardiovascular: Normal rate, regular rhythm. Grossly normal heart sounds.  Good peripheral circulation. Respiratory: Normal respiratory effort.  No retractions. Lungs CTAB. Gastrointestinal: Soft and nontender. No distention.  Musculoskeletal: Right hip no gross deformity is noted. There is moderate tenderness on palpation soft tissue posterior. Range of motion is restricted secondary to pain. Gait was not tested secondary to patient's pain. Neurologic:  Normal speech and language. No gross focal neurologic deficits are appreciated. No gait instability. Skin:  Skin is warm, dry and intact. No rash noted. No  erythema, ecchymosis or abrasions were noted to the right hip. Psychiatric: Mood and affect are normal. Speech and behavior are normal.  ____________________________________________   LABS (all labs ordered are listed, but only abnormal results are displayed)  Labs Reviewed  CBC WITH DIFFERENTIAL/PLATELET - Abnormal; Notable for the following:    WBC 16.1 (*)    Platelets 128 (*)    Neutro Abs 13.3 (*)    Lymphs Abs 0.7 (*)    Monocytes Absolute 1.9 (*)    All other components within normal limits  BASIC METABOLIC PANEL - Abnormal; Notable for the following:    Sodium 132 (*)    Chloride 98 (*)    Glucose, Bld 114 (*)    All other components within normal limits  URINALYSIS COMPLETEWITH MICROSCOPIC ([facility] ONLY) - Abnormal; Notable for the following:    Color, Urine YELLOW (*)    APPearance CLEAR (*)    Hgb urine dipstick 1+ (*)    Protein, ur 30 (*)    All other components within normal limits   _ PROCEDURES  Procedure(s) performed: None  Critical Care performed: No  ____________________________________________   INITIAL IMPRESSION / ASSESSMENT AND PLAN / ED COURSE  Pertinent labs & imaging results that were available during my care of the patient were reviewed by me and considered in my medical decision making (see chart for details).  Patient gave a history to the nurses that he has used IV drugs in the past and as this is why they were having a great time starting an IV and giving him fluids. He denied drug use to this provider. Patient was given Toradol IV while in the emergency room and also given a prescription for redness on 60 mg to taper over the next 6 days. Clinical diagnosis leans more toward bursitis to his right hip. Patient is encouraged to follow-up with the orthopedist on call today who is over at [facility]. He is encouraged to call and make an appointment for follow-up. ____________________________________________   FINAL CLINICAL IMPRESSION(S) /  ED DIAGNOSES  Final diagnoses:  Acute hip pain, right      Tommi Rumps, PA-C 07/26/15 1826  Tommi Rumps, PA-C 08/08/15 1423  Arnaldo Natal, MD 08/17/15 2332

## 2015-07-26 NOTE — ED Notes (Signed)
PT reports that he has been having pain in his rt hip since 1/6. Pt reports he was seen here that day and since pain has not improved.

## 2017-01-14 IMAGING — CR DG HIP (WITH OR WITHOUT PELVIS) 2-3V*R*
3 series · 3 of 3 positions shown · non-contrast
Comparison: CT abdomen pelvis 05/11/2015.

CLINICAL DATA: Patient with right hip pain. No known injury.
Initial encounter.

EXAM:
DG HIP (WITH OR WITHOUT PELVIS) 2-3V RIGHT

[pelvis ap]
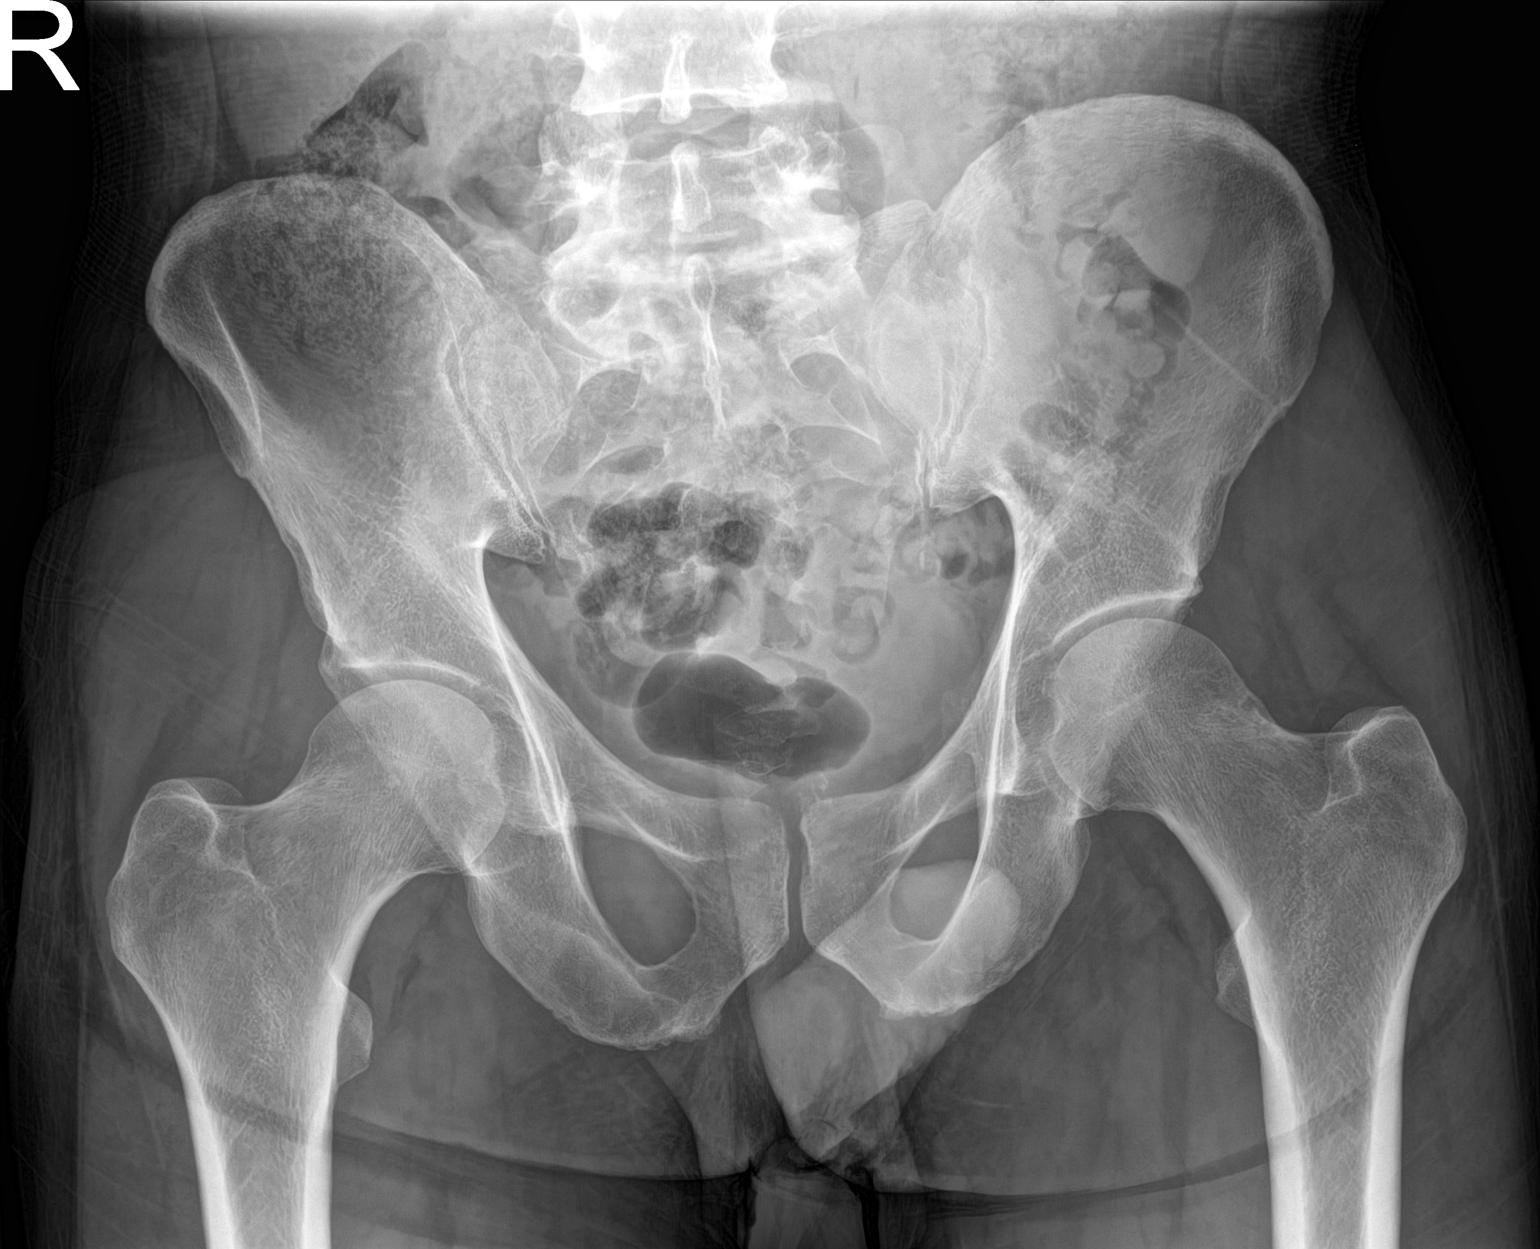

[hip ap]
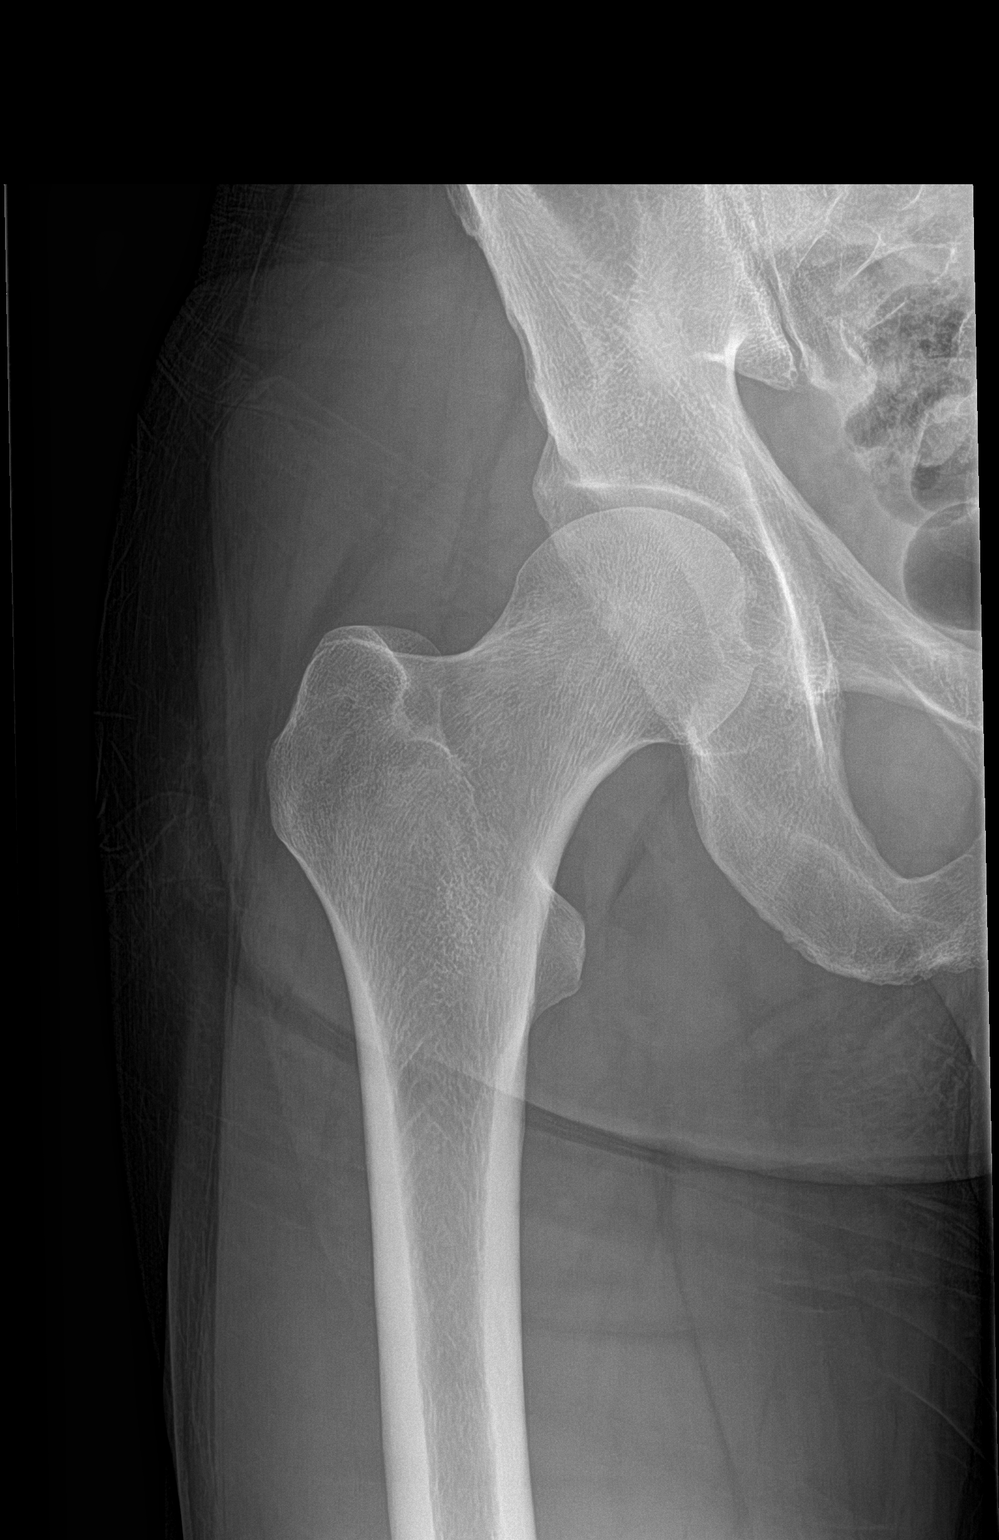

[hip lat]
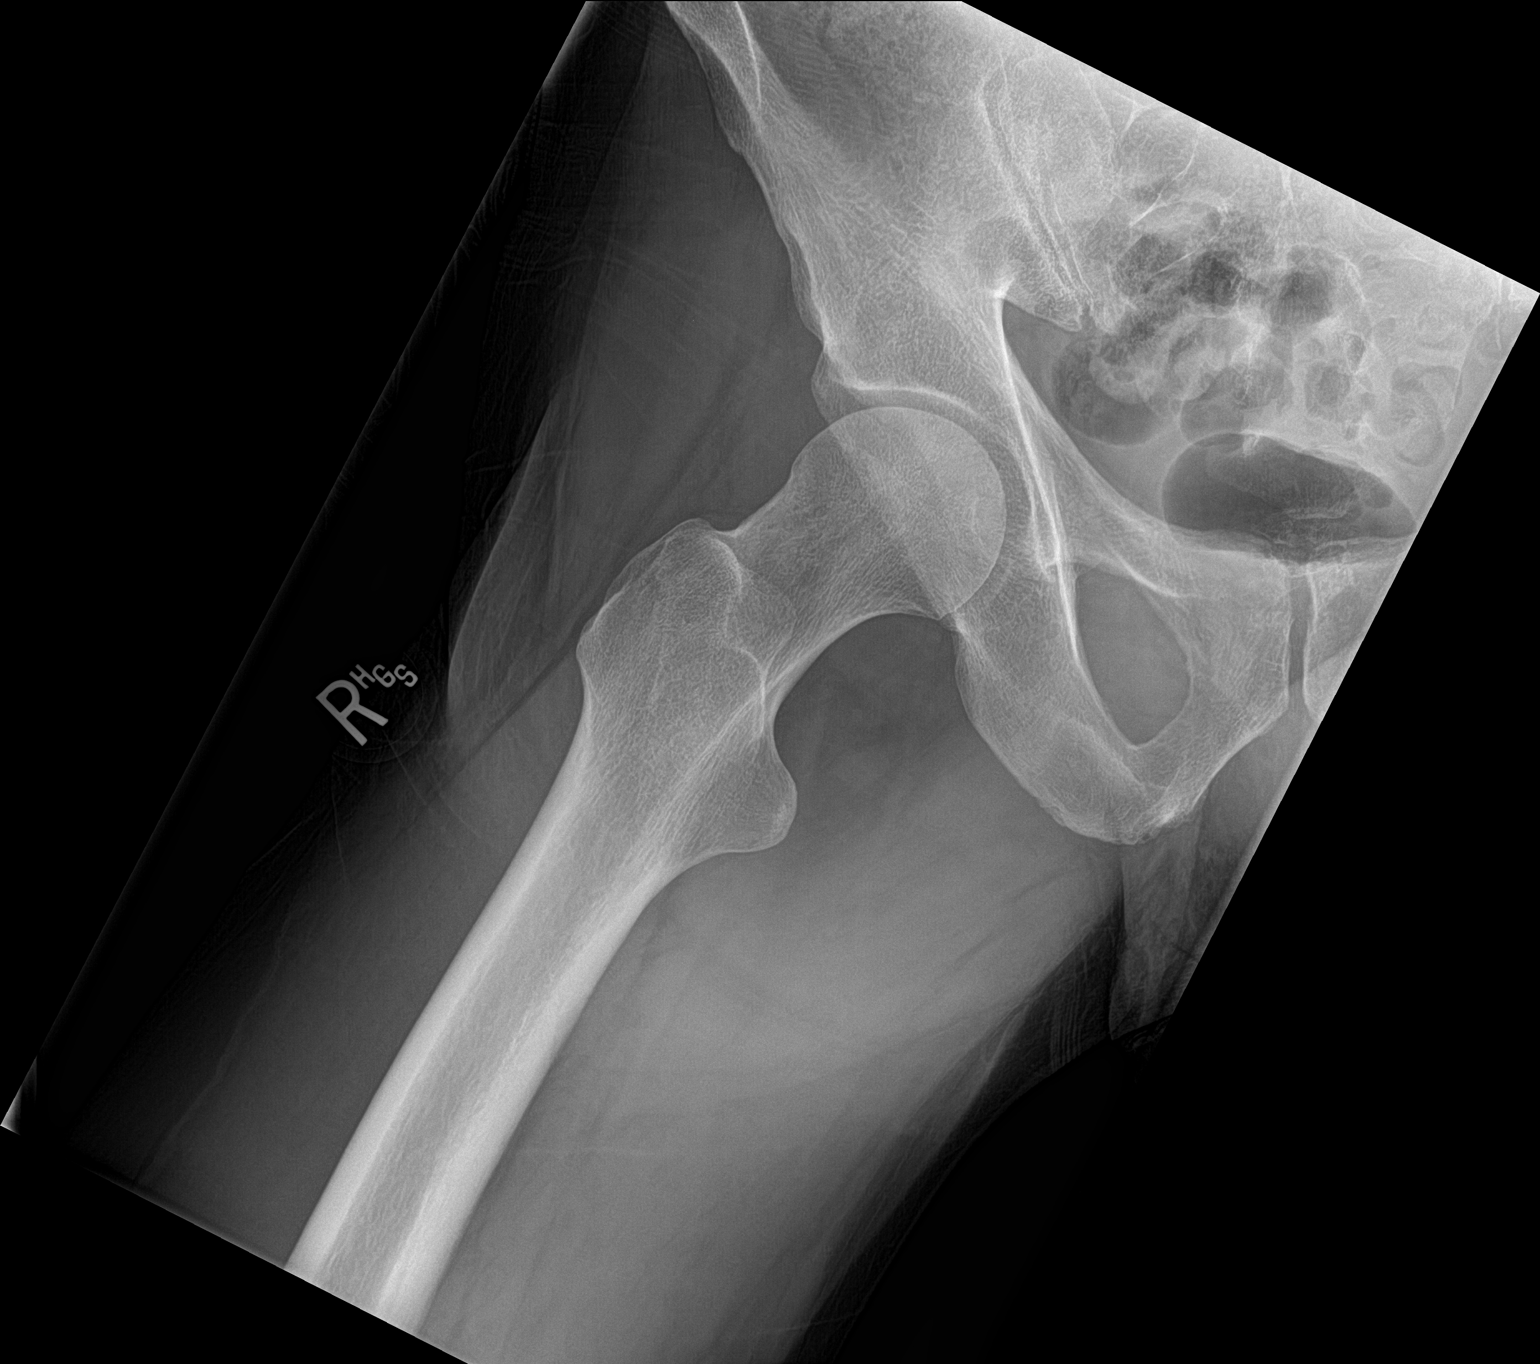

[3 of 3 positions shown; findings below may reference images not displayed]

FINDINGS: Normal anatomic alignment. No evidence for acute fracture or
dislocation. Regional soft tissues are unremarkable.
IMPRESSION: Negative.

## 2022-06-08 ENCOUNTER — Emergency Department
Admission: EM | Admit: 2022-06-08 | Discharge: 2022-06-08 | Disposition: A | Discharge status: Home / Self Care | Payer: Self-pay | Attending: Emergency Medicine | Admitting: Emergency Medicine

## 2022-06-08 ENCOUNTER — Other Ambulatory Visit: Payer: Self-pay

## 2022-06-08 DIAGNOSIS — K0889 Other specified disorders of teeth and supporting structures: Secondary | ICD-10-CM | POA: Insufficient documentation

## 2022-06-08 DIAGNOSIS — I10 Essential (primary) hypertension: Secondary | ICD-10-CM | POA: Insufficient documentation

## 2022-06-08 MED ORDER — DULOXETINE HCL 20 MG PO CPEP: 20.0000 mg | ORAL_CAPSULE | Freq: Every day | ORAL | 11 refills | Status: AC

## 2022-06-08 MED ORDER — MELOXICAM 15 MG PO TABS: 15.0000 mg | ORAL_TABLET | Freq: Every day | ORAL | 0 refills | Status: AC

## 2022-06-08 MED ORDER — LISINOPRIL 20 MG PO TABS: 40.0000 mg | ORAL_TABLET | Freq: Every day | ORAL | 11 refills | Status: AC

## 2022-06-08 MED ORDER — AMOXICILLIN-POT CLAVULANATE 875-125 MG PO TABS: 1.0000 | ORAL_TABLET | Freq: Two times a day (BID) | ORAL | 0 refills | Status: AC

## 2022-06-08 NOTE — Discharge Instructions (Addendum)
-  Please take the full course of the amoxicillin/Clavulin as prescribed.  You may take the meloxicam as needed for pain.  You may additionally take Tylenol on top of this if needed.  -Please continue to take your lisinopril daily.  Please follow-up with your primary care provider as discussed.  -Please take your duloxetine as prescribed.  Follow-up with your primary care provider as discussed.  -Please follow-up with the dental clinic as discussed.  -Return to the emergency department anytime if you begin to experience any new or worsening symptoms.

## 2022-06-08 NOTE — ED Triage Notes (Addendum)
Pt to ED for dental complaint. Pt right jaw has been hurting x 3-5 days. Pt states he just moved here and doesn't have a dentist. Pt is CAOx4. Ambulatory and in no acute distress. Pt has taken tylenol and some of his sisters amox that she had left over. Pt also states he has not had his BP meds in over 2 months. He takes bP meds and cymbalta that he needs refilled.

## 2022-06-08 NOTE — ED Notes (Signed)
Pt BP elevated in both arms. Pt non compliant with BP meds. Pt has no other symptoms.

## 2022-06-08 NOTE — ED Provider Notes (Signed)
[facility] Provider Note    None    (approximate)   History   Chief Complaint Dental Pain   HPI Steve Webster is a 51 y.o. male, history of hypertension, kidney stones, presents to the emergency department for dental pain.  Patient states that his right lower jaw has been hurting for the past 3 to 5 days.  He states that he has gotten episodes of dental pain/infections in the past and later this is the cause.  He states that he has poor dentition and plans to see a dentist soon.  He says that he took 2 doses of his sisters amoxicillin, which reportedly helped his symptoms, however the pain in his back.  In addition, patient states that he also has a history of high blood pressure, would like refills of his lisinopril.  He also needs a refill of his Cymbalta.  He states that he just moved here and plans to establish with a new PCP soon.  Denies any other symptoms at this time.  History Limitations: No limitations.        Physical Exam  Triage Vital Signs: ED Triage Vitals  Enc Vitals Group     BP 06/08/22 1809 (!) 190/100     Pulse Rate 06/08/22 1806 100     Resp 06/08/22 1806 16     Temp 06/08/22 1806 98 F (36.7 C)     Temp Source 06/08/22 1806 Oral     SpO2 06/08/22 1806 98 %     Weight 06/08/22 1807 198 lb (89.8 kg)     Height 06/08/22 1807 5\' 11"  (1.803 m)     Head Circumference --      Peak Flow --      Pain Score 06/08/22 1807 8     Pain Loc --      Pain Edu? --      Excl. in GC? --     Most recent vital signs: Vitals:   06/08/22 1806 06/08/22 1809  BP:  (!) 190/100  Pulse: 100   Resp: 16   Temp: 98 F (36.7 C)   SpO2: 98%     General: Awake, NAD.  Skin: Warm, dry. No rashes or lesions.  Eyes: PERRL. Conjunctivae normal.  CV: Good peripheral perfusion.  Resp: Normal effort.  Abd: Soft, non-tender. No distention.  Neuro: At baseline. No gross neurological deficits.  Musculoskeletal: Normal ROM of all extremities.  Focused  Exam: Very poor dentition.  Multiple dental caries.  Appears to be a old dental fracture along the first molar on the right lower side.  This appears to be the site of the patient's pain.  No active bleeding or discharge.  No obvious abscesses.   Physical Exam    ED Results / Procedures / Treatments  Labs (all labs ordered are listed, but only abnormal results are displayed) Labs Reviewed - No data to display   EKG N/A.    RADIOLOGY  ED Provider Interpretation: N/A.  No results found.  PROCEDURES:  Critical Care performed: N/A.  Procedures    MEDICATIONS ORDERED IN ED: Medications - No data to display   IMPRESSION / MDM / ASSESSMENT AND PLAN / ED COURSE  I reviewed the triage vital signs and the nursing notes.                              Differential diagnosis includes, but is not limited to, dental  pain, odontogenic infection, pulpitis, gingivitis, hypertension, depression.   Assessment/Plan Patient presents with dental pain and request for refills.  We are in the waiting room at this time.  He states that he does not want a formal evaluation or work-up.  He states he would just like a prescription for amoxicillin, as well as refills of his lisinopril and Cymbalta.  I suspect that his dental pain is likely secondary to pulpitis versus odontogenic infection.  He does not appear to have any significant throat swelling or masses along the maxillofacial or neck region.  We will provide him with a prescription for Augmentin and meloxicam in regards to this.  In regards to his blood pressure, his blood pressure is notably elevated today at 190/100.  He is currently asymptomatic at this time.  He states that he was on lisinopril 40 mg, but has not had it over the past few months because he ran out.  We will provide him with a refill of this.  In regards to his Cymbalta refill, he states that he was on 20 mg of Cymbalta for depression.  He states that this dosage worked well  for him, but he has not taken it for past couple months for the same reason stated above.  We will provide him with a refill of this.  However, I did strongly encourage him to follow-up with his primary care provider and advised him that we would not be refilling his medications regularly.  He expressed understanding.  We will discharge.  Provided the patient with anticipatory guidance, return precautions, and educational material. Encouraged the patient to return to the emergency department at any time if they begin to experience any new or worsening symptoms. Patient expressed understanding and agreed with the plan.   Patient's presentation is most consistent with acute, uncomplicated illness.       FINAL CLINICAL IMPRESSION(S) / ED DIAGNOSES   Final diagnoses:  Pain, dental  Hypertension, unspecified type     Rx / DC Orders   ED Discharge Orders          Ordered    amoxicillin-clavulanate (AUGMENTIN) 875-125 MG tablet  2 times daily        06/08/22 1840    lisinopril (ZESTRIL) 20 MG tablet  Daily        06/08/22 1840    DULoxetine (CYMBALTA) 20 MG capsule  Daily        06/08/22 1840    meloxicam (MOBIC) 15 MG tablet  Daily        06/08/22 1840             Note:  This document was prepared using Dragon voice recognition software and may include unintentional dictation errors.   Varney Daily, [place] 06/08/22 1944    Sharman Cheek, MD 06/11/22 [phone]
